# Patient Record
Sex: Male | Born: 1985 | Hispanic: No | Marital: Married | Smoking: Never smoker
Health system: Southern US, Community
[De-identification: ages and names within clinical notes are randomized; demographics above are authoritative.]

---

## 2016-12-27 ENCOUNTER — Ambulatory Visit: Payer: Managed Care, Other (non HMO) | Admitting: Family Medicine

## 2016-12-27 ENCOUNTER — Telehealth: Payer: Self-pay | Admitting: *Deleted

## 2016-12-27 NOTE — Telephone Encounter (Signed)
Can Geoffrey Brown see this pt on Thursday for a new patient appointment in the afternoon. This pt is highly frustrated with being cancelled for today. Geoffrey Brown is wiling to establish with the first available

## 2016-12-27 NOTE — Telephone Encounter (Signed)
As long as it is a thirty slot and not the 1130. That should be fine.

## 2016-12-27 NOTE — Telephone Encounter (Signed)
Pt will need a 30 min appt. There are no thirty minute appt on Thursday 12/29/16. Pt can schedule with another provider in the office if they want that day.

## 2016-12-27 NOTE — Telephone Encounter (Signed)
Thanks, pt is scheduled.

## 2016-12-27 NOTE — Telephone Encounter (Signed)
Please give a time and day to place patient for a New Patient appointment on Thursday 12/29/16-this will be his next available time off , pt was upset about having to reschedule.-Thanks

## 2016-12-29 ENCOUNTER — Encounter: Payer: Self-pay | Admitting: Family

## 2016-12-29 ENCOUNTER — Ambulatory Visit (INDEPENDENT_AMBULATORY_CARE_PROVIDER_SITE_OTHER): Payer: Managed Care, Other (non HMO) | Admitting: Family

## 2016-12-29 VITALS — BP 100/78 | HR 80 | Temp 98.3°F | Resp 15 | Ht 66.5 in | Wt 191.4 lb

## 2016-12-29 DIAGNOSIS — Z0000 Encounter for general adult medical examination without abnormal findings: Secondary | ICD-10-CM

## 2016-12-29 DIAGNOSIS — M25532 Pain in left wrist: Secondary | ICD-10-CM | POA: Diagnosis not present

## 2016-12-29 NOTE — Assessment & Plan Note (Addendum)
Up-to-date immunizations. Testicular exam performed. Encouraged to exercise. Labs ordered.

## 2016-12-29 NOTE — Progress Notes (Signed)
Pre visit review using our clinic review tool, if applicable. No additional management support is needed unless otherwise documented below in the visit note. 

## 2016-12-29 NOTE — Patient Instructions (Signed)
Pleasure meeting you  Let me know if wrist doesn't continue to get better  Fasting lab appt tomorrow   This is  Dr. Melina Schools  example of a  "Low GI"  Diet:  It will allow you to lose 4 to 8  lbs  per month if you follow it carefully.  Your goal with exercise is a minimum of 30 minutes of aerobic exercise 5 days per week (Walking does not count once it becomes easy!)    All of the foods can be found at grocery stores and in bulk at [employer].  The Atkins protein bars and shakes are available in more varieties at Target, WalMart and [employer]'s Foods.     7 AM Breakfast:  Choose from the following:  Low carbohydrate Protein  Shakes (I recommend the  Premier Protein chocolate shakes,  EAS AdvantEdge "Carb Control" shakes  Or the Atkins shakes all are under 3 net carbs)     a scrambled egg/bacon/cheese burrito made with Mission's "carb balance" whole wheat tortilla  (about 10 net carbs )  Medical laboratory scientific officer (basically a quiche without the pastry crust) that is eaten cold and very convenient way to get your eggs.  8 carbs)  If you make your own protein shakes, avoid bananas and pineapple,  And use low carb greek yogurt or original /unsweetened almond or soy milk    Avoid cereal and bananas, oatmeal and cream of wheat and grits. They are loaded with carbohydrates!   10 AM: high protein snack:  Protein bar by Atkins (the snack size, under 200 cal, usually < 6 net carbs).    A stick of cheese:  Around 1 carb,  100 cal     Dannon Light n Fit [place] Yogurt  (80 cal, 8 carbs)  Other so called "protein bars" and Greek yogurts tend to be loaded with carbohydrates.  Remember, in food advertising, the word "energy" is synonymous for " carbohydrate."  Lunch:   A Sandwich using the bread choices listed, Can use any  Eggs,  lunchmeat, grilled meat or canned tuna), avocado, regular mayo/mustard  and cheese.  A Salad using blue cheese, ranch,  Goddess or vinagrette,  Avoid taco shells,  croutons or "confetti" and no "candied nuts" but regular nuts OK.   No pretzels, nabs  or chips.  Pickles and miniature sweet peppers are a good low carb alternative that provide a "crunch"  The bread is the only source of carbohydrate in a sandwich and  can be decreased by trying some of the attached alternatives to traditional loaf bread   Avoid "Low fat dressings, as well as Reyne Dumas and [employer] dressings They are loaded with sugar!   3 PM/ Mid day  Snack:  Consider  1 ounce of  almonds, walnuts, pistachios, pecans, peanuts,  Macadamia nuts or a nut medley.  Avoid "granola and granola bars "  Mixed nuts are ok in moderation as long as there are no raisins,  cranberries or dried fruit.   KIND bars are OK if you get the low glycemic index variety   Try the prosciutto/mozzarella cheese sticks by Fiorruci  In deli /backery section   High protein      6 PM  Dinner:     Meat/fowl/fish with a green salad, and either broccoli, cauliflower, green beans, spinach, brussel sprouts or  Lima beans. DO NOT BREAD THE PROTEIN!!      There is a low carb pasta by Dreamfield's that is  acceptable and tastes great: only 5 digestible carbs/serving.( All grocery stores but BJs carry it ) Several ready made meals are available low carb:   Try Michel Angelo's chicken piccata or chicken or eggplant parm over low carb pasta.(Lowes and BJs)   Clifton Custard Sanchez's "Carnitas" (pulled pork, no sauce,  0 carbs) or his beef pot roast to make a dinner burrito (at BJ's)  Pesto over low carb pasta (bj's sells a good quality pesto in the center refrigerated section of the deli   Try satueeing  Roosvelt Harps with mushroooms as a good side   [employer] Giant makes a mashed cauliflower that tastes like mashed potatoes  Whole wheat pasta is still full of digestible carbs and  Not as low in glycemic index as Dreamfield's.   Brown rice is still rice,  So skip the rice and noodles if you eat [place] or [place] (or at least limit to 1/2  cup)  9 PM snack :   Breyer's "low carb" fudgsicle or  ice cream bar (Carb Smart line), or  Weight Watcher's ice cream bar , or another "no sugar added" ice cream;  a serving of fresh berries/cherries with whipped cream   Cheese or DANNON'S LlGHT N FIT GREEK YOGURT  8 ounces of [place] unsweetened almond/cococunut milk    Treat yourself to a parfait made with whipped cream blueberiies, walnuts and vanilla greek yogurt  Avoid bananas, pineapple, grapes  and watermelon on a regular basis because they are high in sugar.  THINK OF THEM AS DESSERT  Remember that snack Substitutions should be less than 10 NET carbs per serving and meals < 20 carbs. Remember to subtract fiber grams to get the "net carbs."  @  Health Maintenance, Male A healthy lifestyle and preventive care is important for your health and wellness. Ask your health care provider about what schedule of regular examinations is right for you. What should I know about weight and diet?  Eat a Healthy Diet  Eat plenty of vegetables, fruits, whole grains, low-fat dairy products, and lean protein.  Do not eat a lot of foods high in solid fats, added sugars, or salt. Maintain a Healthy Weight  Regular exercise can help you achieve or maintain a healthy weight. You should:  Do at least 150 minutes of exercise each week. The exercise should increase your heart rate and make you sweat (moderate-intensity exercise).  Do strength-training exercises at least twice a week. Watch Your Levels of Cholesterol and Blood Lipids  Have your blood tested for lipids and cholesterol every 5 years starting at 30 years of age. If you are at high risk for heart disease, you should start having your blood tested when you are 30 years old. You may need to have your cholesterol levels checked more often if:  Your lipid or cholesterol levels are high.  You are older than 30 years of age.  You are at high risk for heart  disease. What should I know about cancer screening? Many types of cancers can be detected early and may often be prevented. Lung Cancer  You should be screened every year for lung cancer if:  You are a current smoker who has smoked for at least 30 years.  You are a former smoker who has quit within the past 15 years.  Talk to your health care provider about your screening options, when you should start screening, and how often you should be screened. Colorectal Cancer  Routine colorectal cancer screening usually begins  at 30 years of age and should be repeated every 5-10 years until you are 30 years old. You may need to be screened more often if early forms of precancerous polyps or small growths are found. Your health care provider may recommend screening at an earlier age if you have risk factors for colon cancer.  Your health care provider may recommend using home test kits to check for hidden blood in the stool.  A small camera at the end of a tube can be used to examine your colon (sigmoidoscopy or colonoscopy). This checks for the earliest forms of colorectal cancer. Prostate and Testicular Cancer  Depending on your age and overall health, your health care provider may do certain tests to screen for prostate and testicular cancer.  Talk to your health care provider about any symptoms or concerns you have about testicular or prostate cancer. Skin Cancer  Check your skin from head to toe regularly.  Tell your health care provider about any new moles or changes in moles, especially if:  There is a change in a mole's size, shape, or color.  You have a mole that is larger than a pencil eraser.  Always use sunscreen. Apply sunscreen liberally and repeat throughout the day.  Protect yourself by wearing long sleeves, pants, a wide-brimmed hat, and sunglasses when outside. What should I know about heart disease, diabetes, and high blood pressure?  If you are 33-69 years of age,  have your blood pressure checked every 3-5 years. If you are 6 years of age or older, have your blood pressure checked every year. You should have your blood pressure measured twice-once when you are at a hospital or clinic, and once when you are not at a hospital or clinic. Record the average of the two measurements. To check your blood pressure when you are not at a hospital or clinic, you can use:  An automated blood pressure machine at a pharmacy.  A home blood pressure monitor.  Talk to your health care provider about your target blood pressure.  If you are between 60-47 years old, ask your health care provider if you should take aspirin to prevent heart disease.  Have regular diabetes screenings by checking your fasting blood sugar level.  If you are at a normal weight and have a low risk for diabetes, have this test once every three years after the age of 69.  If you are overweight and have a high risk for diabetes, consider being tested at a younger age or more often.  A one-time screening for abdominal aortic aneurysm (AAA) by ultrasound is recommended for men aged 65-75 years who are current or former smokers. What should I know about preventing infection? Hepatitis B  If you have a higher risk for hepatitis B, you should be screened for this virus. Talk with your health care provider to find out if you are at risk for hepatitis B infection. Hepatitis C  Blood testing is recommended for:  Everyone born from [birth year] through 1964.  Anyone with known risk factors for hepatitis C. Sexually Transmitted Diseases (STDs)  You should be screened each year for STDs including gonorrhea and chlamydia if:  You are sexually active and are younger than 30 years of age.  You are older than 30 years of age and your health care provider tells you that you are at risk for this type of infection.  Your sexual activity has changed since you were last screened and you are at an increased  risk for  chlamydia or gonorrhea. Ask your health care provider if you are at risk.  Talk with your health care provider about whether you are at high risk of being infected with HIV. Your health care provider may recommend a prescription medicine to help prevent HIV infection. What else can I do?  Schedule regular health, dental, and eye exams.  Stay current with your vaccines (immunizations).  Do not use any tobacco products, such as cigarettes, chewing tobacco, and e-cigarettes. If you need help quitting, ask your health care provider.  Limit alcohol intake to no more than 2 drinks per day. One drink equals 12 ounces of beer, 5 ounces of wine, or 1 ounces of hard liquor.  Do not use street drugs.  Do not share needles.  Ask your health care provider for help if you need support or information about quitting drugs.  Tell your health care provider if you often feel depressed.  Tell your health care provider if you have ever been abused or do not feel safe at home. This information is not intended to replace advice given to you by your health care provider. Make sure you discuss any questions you have with your health care provider. Document Released: 02/18/2008 Document Revised: 04/20/2016 Document Reviewed: 05/26/2015 Elsevier Interactive Patient Education  2017 [employer].

## 2016-12-29 NOTE — Progress Notes (Signed)
Subjective:    Patient ID: Geoffrey Brown, male    DOB: 08-Feb-1986, 31 y.o.   MRN: [id number]  CC: [name] is a 31 y.o. male who presents today for physical exam.    HPI: Hasn't had PCP in 3 years.   No concern for STDs. No masses, swelling noted in testicles. No urinary hesitancy, reduced urine stream. 4 days ago was playing cricket and injured his left wrist he does not remember how. Notes left lateral wrist pain which is improved with ice, compression of the past couple days. Notes decreased range of motion. No numbness or tingling.    Colorectal  Cancer Screening: No early family history.  Prostate Cancer Screening: Has been discussed with patient; patient declined following PSA based on risks versus benefits. No prostate cancer family history.  Lung Cancer Screening: No 30 year pack year history and > 55 years.  Immunizations       Tetanus - UTD        HIV Screening- Candidate for, declines Labs: Screening labs today. Exercise: Gets regular exercise.  Alcohol use: Occasional Smoking/tobacco use: Nonsmoker.  Regular dental exams: In need of dental exam. Wears seat belt: Yes. Skin: no new, concerning lesions. No h/o skin cancer  HISTORY:  No past medical history on file.  No past surgical history on file. Family History  Problem Relation Age of Onset  . Diabetes Mother   . Diabetes Father   . Colon cancer Neg Hx   . Prostate cancer Neg Hx       ALLERGIES: Lobster [shellfish allergy]  No current outpatient prescriptions on file prior to visit.   No current facility-administered medications on file prior to visit.     Social History  Substance Use Topics  . Smoking status: Never Smoker  . Smokeless tobacco: Never Used  . Alcohol use Yes    Review of Systems  Constitutional: Negative for chills and fever.  HENT: Negative for congestion.   Respiratory: Negative for cough.   Cardiovascular: Negative for chest pain, palpitations and leg swelling.    Gastrointestinal: Negative for diarrhea, nausea and vomiting.  Genitourinary: Negative for difficulty urinating, scrotal swelling and testicular pain.  Musculoskeletal: Positive for joint swelling. Negative for myalgias.  Skin: Negative for rash.  Neurological: Negative for headaches.  Hematological: Negative for adenopathy.  Psychiatric/Behavioral: Negative for confusion.      Objective:    BP 100/78 (BP Location: Right Arm, Patient Position: Sitting, Cuff Size: Normal)   Pulse 80   Temp 98.3 F (36.8 C) (Oral)   Resp 15   Ht 5' 6.5" (1.689 m)   Wt 191 lb 6.4 oz (86.8 kg)   SpO2 97%   BMI 30.43 kg/m   BP Readings from Last 3 Encounters:  12/29/16 100/78   Wt Readings from Last 3 Encounters:  12/29/16 191 lb 6.4 oz (86.8 kg)    Physical Exam  Constitutional: Vital signs are normal. He appears well-developed and well-nourished.  HENT:  Head: Normocephalic and atraumatic.  Right Ear: Hearing, tympanic membrane, external ear and ear canal normal. No drainage, swelling or tenderness. Tympanic membrane is not injected, not erythematous and not bulging. No middle ear effusion. No decreased hearing is noted.  Left Ear: Hearing, tympanic membrane, external ear and ear canal normal. No drainage, swelling or tenderness. Tympanic membrane is not injected, not erythematous and not bulging.  No middle ear effusion. No decreased hearing is noted.  Nose: Nose normal. Right sinus exhibits no maxillary sinus tenderness and  no frontal sinus tenderness. Left sinus exhibits no maxillary sinus tenderness and no frontal sinus tenderness.  Mouth/Throat: Uvula is midline, oropharynx is clear and moist and mucous membranes are normal. No oropharyngeal exudate, posterior oropharyngeal edema, posterior oropharyngeal erythema or tonsillar abscesses.  Eyes: Conjunctivae are normal.  Neck: No thyroid mass and no thyromegaly present.  Cardiovascular: Regular rhythm and normal heart sounds.    Pulmonary/Chest: Effort normal and breath sounds normal. No respiratory distress. He has no wheezes. He has no rhonchi. He has no rales.  Abdominal: Soft. Normal appearance and bowel sounds are normal. He exhibits no distension, no fluid wave, no ascites and no mass. There is no tenderness. There is no rigidity, no rebound, no guarding, no CVA tenderness, no tenderness at McBurney's point and negative Murphy's sign.  Genitourinary: Testes normal and penis normal. Cremasteric reflex is present. Right testis shows no mass, no swelling and no tenderness. Left testis shows no mass, no swelling and no tenderness. Circumcised.  Genitourinary Comments: Testicular exam performed. No masses or asymmetry appreciated.   Musculoskeletal:       Left wrist: He exhibits decreased range of motion, tenderness, bony tenderness and swelling.       Arms: Grip strength normal. Palpable radial pulses and sensation intact.  No pain or limited ROM with  okay sign. No tenderness of CMC or snuffbox tenderness noted.   tenderness along ulnar border of wrist. No tenderness along radial border of wrist.  No pain with resisted wrist dorsiflexion.   Lymphadenopathy:       Head (right side): No submental, no submandibular, no tonsillar, no preauricular, no posterior auricular and no occipital adenopathy present.       Head (left side): No submental, no submandibular, no tonsillar, no preauricular, no posterior auricular and no occipital adenopathy present.    He has no cervical adenopathy.    He has no axillary adenopathy.  Neurological: He is alert.  Skin: Skin is warm and dry.  Psychiatric: He has a normal mood and affect. His speech is normal and behavior is normal.  Vitals reviewed.      Assessment & Plan:   Problem List Items Addressed This Visit      Other   Routine physical examination - Primary    Up-to-date immunizations. Testicular exam performed. Encouraged to exercise. Labs ordered.       Relevant  Orders   CBC with Differential/Platelet   Comprehensive metabolic panel   Lipid panel   TSH   VITAMIN D 25 Hydroxy (Vit-D Deficiency, Fractures)   Hemoglobin A1c   Left wrist pain    Unsure if foosh injury as patient doesn't recall . Reassured as pain, swelling, ROM has improved. Offered x-ray and patient politely declined as he would like to see if pain, swelling continues to improve. He will let me know if not.          Mr. Akhtar does not currently have medications on file.   No orders of the defined types were placed in this encounter.   Return precautions given.   Risks, benefits, and alternatives of the medications and treatment plan prescribed today were discussed, and patient expressed understanding.   Education regarding symptom management and diagnosis given to patient on AVS.   Continue to follow with Rennie Plowman, FNP for routine health maintenance.   Rohin Lucianne Muss and I agreed with plan.   Rennie Plowman, FNP

## 2016-12-29 NOTE — Assessment & Plan Note (Signed)
Unsure if foosh injury as patient doesn't recall . Reassured as pain, swelling, ROM has improved. Offered x-ray and patient politely declined as he would like to see if pain, swelling continues to improve. He will let me know if not.

## 2016-12-30 ENCOUNTER — Other Ambulatory Visit (INDEPENDENT_AMBULATORY_CARE_PROVIDER_SITE_OTHER): Payer: Managed Care, Other (non HMO)

## 2016-12-30 DIAGNOSIS — Z0000 Encounter for general adult medical examination without abnormal findings: Secondary | ICD-10-CM | POA: Diagnosis not present

## 2016-12-31 LAB — LIPID PANEL
Chol/HDL Ratio: 5.3 ratio — ABNORMAL HIGH (ref 0.0–5.0)
Cholesterol, Total: 181 mg/dL (ref 100–199)
HDL: 34 mg/dL — AB (ref >39)
LDL Calculated: 120 mg/dL — ABNORMAL HIGH (ref 0–99)
TRIGLYCERIDES: 135 mg/dL (ref 0–149)
VLDL CHOLESTEROL CAL: 27 mg/dL (ref 5–40)

## 2016-12-31 LAB — CBC WITH DIFFERENTIAL/PLATELET
BASOS ABS: 0 10*3/uL (ref 0.0–0.2)
BASOS: 1 %
EOS (ABSOLUTE): 0.2 10*3/uL (ref 0.0–0.4)
Eos: 3 %
Hematocrit: 45.6 % (ref 37.5–51.0)
Hemoglobin: 15.5 g/dL (ref 13.0–17.7)
IMMATURE GRANS (ABS): 0 10*3/uL (ref 0.0–0.1)
IMMATURE GRANULOCYTES: 0 %
LYMPHS: 47 %
Lymphocytes Absolute: 3 10*3/uL (ref 0.7–3.1)
MCH: 27.3 pg (ref 26.6–33.0)
MCHC: 34 g/dL (ref 31.5–35.7)
MCV: 80 fL (ref 79–97)
MONOS ABS: 0.4 10*3/uL (ref 0.1–0.9)
Monocytes: 7 %
NEUTROS PCT: 42 %
Neutrophils Absolute: 2.6 10*3/uL (ref 1.4–7.0)
Platelets: 276 10*3/uL (ref 150–379)
RBC: 5.68 x10E6/uL (ref 4.14–5.80)
RDW: 13.6 % (ref 12.3–15.4)
WBC: 6.2 10*3/uL (ref 3.4–10.8)

## 2016-12-31 LAB — COMPREHENSIVE METABOLIC PANEL
A/G RATIO: 1.7 (ref 1.2–2.2)
ALT: 42 IU/L (ref 0–44)
AST: 27 IU/L (ref 0–40)
Albumin: 4.6 g/dL (ref 3.5–5.5)
Alkaline Phosphatase: 64 IU/L (ref 39–117)
BILIRUBIN TOTAL: 0.5 mg/dL (ref 0.0–1.2)
BUN/Creatinine Ratio: 13 (ref 9–20)
BUN: 16 mg/dL (ref 6–20)
CALCIUM: 9.3 mg/dL (ref 8.7–10.2)
CHLORIDE: 100 mmol/L (ref 96–106)
CO2: 26 mmol/L (ref 18–29)
Creatinine, Ser: 1.2 mg/dL (ref 0.76–1.27)
GFR calc Af Amer: 93 mL/min/{1.73_m2} (ref >59)
GFR calc non Af Amer: 81 mL/min/{1.73_m2} (ref >59)
GLUCOSE: 90 mg/dL (ref 65–99)
Globulin, Total: 2.7 g/dL (ref 1.5–4.5)
POTASSIUM: 4.8 mmol/L (ref 3.5–5.2)
Sodium: 140 mmol/L (ref 134–144)
TOTAL PROTEIN: 7.3 g/dL (ref 6.0–8.5)

## 2016-12-31 LAB — HEMOGLOBIN A1C
Est. average glucose Bld gHb Est-mCnc: 117 mg/dL
HEMOGLOBIN A1C: 5.7 % — AB (ref 4.8–5.6)

## 2016-12-31 LAB — TSH: TSH: 3.85 u[IU]/mL (ref 0.450–4.500)

## 2016-12-31 LAB — VITAMIN D 25 HYDROXY (VIT D DEFICIENCY, FRACTURES): Vit D, 25-Hydroxy: 14 ng/mL — ABNORMAL LOW (ref 30.0–100.0)

## 2017-01-03 ENCOUNTER — Ambulatory Visit: Payer: Managed Care, Other (non HMO) | Admitting: Family Medicine

## 2017-01-05 ENCOUNTER — Encounter: Payer: Self-pay | Admitting: Family

## 2017-04-18 ENCOUNTER — Ambulatory Visit (INDEPENDENT_AMBULATORY_CARE_PROVIDER_SITE_OTHER): Payer: Managed Care, Other (non HMO) | Admitting: Family

## 2017-04-18 ENCOUNTER — Encounter: Payer: Self-pay | Admitting: Family

## 2017-04-18 VITALS — BP 130/88 | HR 61 | Temp 98.3°F | Ht 66.5 in | Wt 188.8 lb

## 2017-04-18 DIAGNOSIS — H938X2 Other specified disorders of left ear: Secondary | ICD-10-CM | POA: Diagnosis not present

## 2017-04-18 DIAGNOSIS — R300 Dysuria: Secondary | ICD-10-CM | POA: Diagnosis not present

## 2017-04-18 NOTE — Progress Notes (Signed)
Pre visit review using our clinic review tool, if applicable. No additional management support is needed unless otherwise documented below in the visit note. 

## 2017-04-18 NOTE — Assessment & Plan Note (Signed)
Working diagnosis of keloid versus dermoid cyst. Due to size, placed referral dermatology further evaluation and possibly removal.

## 2017-04-18 NOTE — Assessment & Plan Note (Signed)
Nonspecific at this time. Working diagnosis of candidiasis, intermittently since patient is not circumcised. No erythema, exudate seen on exam today. Education and return precautions given to patient today. Politely declines any STD testing.

## 2017-04-18 NOTE — Patient Instructions (Signed)
Referral to derm for suspected keloid  You may be getting rash from time to time from not being uncircumsized. If you have a rash or any penile discharge, please return to office for further evaluation

## 2017-04-18 NOTE — Progress Notes (Signed)
Subjective:    Patient ID: Geoffrey Brown, male    DOB: 06/06/1986, 31 y.o.   MRN: [id number]  CC: Geoffrey Brown is a 31 y.o. male who presents today for an acute visit.    HPI: CC: left ear 'lump' for years in the bottom of lobe, comes and goes,  recently bigger in size  Ear pierced in left ear and re-pierced again 2017, and then a couple months later, 'small lump' appeared. Has had earring removed for 6 months, and lump getting bigger. No discharge, fever, increased warmth.   Patient also complains of intermittent burning sensation with urination, this may happen once every several months. As been going on for the past couple years. He denies any concerns for any STDs. No lesions, rashes no changes in discharge from penis. Engaged which is why he brings this up today.         HISTORY:  No past medical history on file. No past surgical history on file. Family History  Problem Relation Age of Onset  . Diabetes Mother   . Diabetes Father   . Colon cancer Neg Hx   . Prostate cancer Neg Hx     Allergies: Lobster [shellfish allergy] No current outpatient prescriptions on file prior to visit.   No current facility-administered medications on file prior to visit.     Social History  Substance Use Topics  . Smoking status: Never Smoker  . Smokeless tobacco: Never Used  . Alcohol use Yes    Review of Systems  Constitutional: Negative for chills and fever.  Respiratory: Negative for cough.   Cardiovascular: Negative for chest pain and palpitations.  Gastrointestinal: Negative for nausea and vomiting.  Genitourinary: Positive for dysuria. Negative for flank pain, frequency, penile pain, penile swelling, scrotal swelling, testicular pain and urgency.  Skin: Negative for color change and wound.      Objective:    BP 130/88   Pulse 61   Temp 98.3 F (36.8 C) (Oral)   Ht 5' 6.5" (1.689 m)   Wt 188 lb 12.8 oz (85.6 kg)   SpO2 98%   BMI 30.02 kg/m    Physical Exam    Constitutional: He appears well-developed and well-nourished.  HENT:  Head: Normocephalic.    Ears:  Left posterior ear nodule, non tender, non fluctuant. No erythema, streaking.   Cardiovascular: Regular rhythm and normal heart sounds.   Pulmonary/Chest: Effort normal and breath sounds normal. No respiratory distress. He has no wheezes. He has no rhonchi. He has no rales.  Genitourinary: Testes normal and penis normal. Uncircumcised. No penile erythema or penile tenderness. No discharge found.  Genitourinary Comments: No lesions, erythema.  Neurological: He is alert.  Skin: Skin is warm and dry.  Psychiatric: He has a normal mood and affect. His speech is normal and behavior is normal.  Vitals reviewed.      Assessment & Plan:   Problem List Items Addressed This Visit      Nervous and Auditory   Ear mass, left - Primary    Working diagnosis of keloid versus dermoid cyst. Due to size, placed referral dermatology further evaluation and possibly removal.      Relevant Orders   Ambulatory referral to Dermatology     Other   Dysuria    Nonspecific at this time. Working diagnosis of candidiasis, intermittently since patient is not circumcised. No erythema, exudate seen on exam today. Education and return precautions given to patient today. Politely declines any STD testing.  Geoffrey Brown does not currently have medications on file.   No orders of the defined types were placed in this encounter.   Return precautions given.   Risks, benefits, and alternatives of the medications and treatment plan prescribed today were discussed, and patient expressed understanding.   Education regarding symptom management and diagnosis given to patient on AVS.  Continue to follow with Geoffrey Brown, Geoffrey Bruney G, FNP for routine health maintenance.   Geoffrey Brown and I agreed with plan.   Geoffrey PlowmanMargaret Usher Hedberg, FNP

## 2018-03-26 ENCOUNTER — Encounter: Payer: Managed Care, Other (non HMO) | Admitting: Family

## 2018-03-30 ENCOUNTER — Encounter: Payer: Self-pay | Admitting: Family

## 2018-03-30 ENCOUNTER — Ambulatory Visit (INDEPENDENT_AMBULATORY_CARE_PROVIDER_SITE_OTHER): Payer: Managed Care, Other (non HMO) | Admitting: Family

## 2018-03-30 VITALS — BP 122/78 | HR 62 | Temp 98.6°F | Resp 16 | Ht 67.0 in | Wt 193.2 lb

## 2018-03-30 DIAGNOSIS — Z9189 Other specified personal risk factors, not elsewhere classified: Secondary | ICD-10-CM

## 2018-03-30 DIAGNOSIS — Z0000 Encounter for general adult medical examination without abnormal findings: Secondary | ICD-10-CM

## 2018-03-30 DIAGNOSIS — Z889 Allergy status to unspecified drugs, medicaments and biological substances status: Secondary | ICD-10-CM

## 2018-03-30 NOTE — Patient Instructions (Signed)
Great to see you!   Health Maintenance, Male A healthy lifestyle and preventive care is important for your health and wellness. Ask your health care provider about what schedule of regular examinations is right for you. What should I know about weight and diet? Eat a Healthy Diet  Eat plenty of vegetables, fruits, whole grains, low-fat dairy products, and lean protein.  Do not eat a lot of foods high in solid fats, added sugars, or salt.  Maintain a Healthy Weight Regular exercise can help you achieve or maintain a healthy weight. You should:  Do at least 150 minutes of exercise each week. The exercise should increase your heart rate and make you sweat (moderate-intensity exercise).  Do strength-training exercises at least twice a week.  Watch Your Levels of Cholesterol and Blood Lipids  Have your blood tested for lipids and cholesterol every 5 years starting at 31 years of age. If you are at high risk for heart disease, you should start having your blood tested when you are 31 years old. You may need to have your cholesterol levels checked more often if: ? Your lipid or cholesterol levels are high. ? You are older than 31 years of age. ? You are at high risk for heart disease.  What should I know about cancer screening? Many types of cancers can be detected early and may often be prevented. Lung Cancer  You should be screened every year for lung cancer if: ? You are a current smoker who has smoked for at least 30 years. ? You are a former smoker who has quit within the past 15 years.  Talk to your health care provider about your screening options, when you should start screening, and how often you should be screened.  Colorectal Cancer  Routine colorectal cancer screening usually begins at 31 years of age and should be repeated every 5-10 years until you are 31 years old. You may need to be screened more often if early forms of precancerous polyps or small growths are found.  Your health care provider may recommend screening at an earlier age if you have risk factors for colon cancer.  Your health care provider may recommend using home test kits to check for hidden blood in the stool.  A small camera at the end of a tube can be used to examine your colon (sigmoidoscopy or colonoscopy). This checks for the earliest forms of colorectal cancer.  Prostate and Testicular Cancer  Depending on your age and overall health, your health care provider may do certain tests to screen for prostate and testicular cancer.  Talk to your health care provider about any symptoms or concerns you have about testicular or prostate cancer.  Skin Cancer  Check your skin from head to toe regularly.  Tell your health care provider about any new moles or changes in moles, especially if: ? There is a change in a mole's size, shape, or color. ? You have a mole that is larger than a pencil eraser.  Always use sunscreen. Apply sunscreen liberally and repeat throughout the day.  Protect yourself by wearing long sleeves, pants, a wide-brimmed hat, and sunglasses when outside.  What should I know about heart disease, diabetes, and high blood pressure?  If you are 18-39 years of age, have your blood pressure checked every 3-5 years. If you are 40 years of age or older, have your blood pressure checked every year. You should have your blood pressure measured twice-once when you are at a   hospital or clinic, and once when you are not at a hospital or clinic. Record the average of the two measurements. To check your blood pressure when you are not at a hospital or clinic, you can use: ? An automated blood pressure machine at a pharmacy. ? A home blood pressure monitor.  Talk to your health care provider about your target blood pressure.  If you are between 45-79 years old, ask your health care provider if you should take aspirin to prevent heart disease.  Have regular diabetes screenings by  checking your fasting blood sugar level. ? If you are at a normal weight and have a low risk for diabetes, have this test once every three years after the age of 45. ? If you are overweight and have a high risk for diabetes, consider being tested at a younger age or more often.  A one-time screening for abdominal aortic aneurysm (AAA) by ultrasound is recommended for men aged 65-75 years who are current or former smokers. What should I know about preventing infection? Hepatitis B If you have a higher risk for hepatitis B, you should be screened for this virus. Talk with your health care provider to find out if you are at risk for hepatitis B infection. Hepatitis C Blood testing is recommended for:  Everyone born from 1945 through 1965.  Anyone with known risk factors for hepatitis C.  Sexually Transmitted Diseases (STDs)  You should be screened each year for STDs including gonorrhea and chlamydia if: ? You are sexually active and are younger than 31 years of age. ? You are older than 31 years of age and your health care provider tells you that you are at risk for this type of infection. ? Your sexual activity has changed since you were last screened and you are at an increased risk for chlamydia or gonorrhea. Ask your health care provider if you are at risk.  Talk with your health care provider about whether you are at high risk of being infected with HIV. Your health care provider may recommend a prescription medicine to help prevent HIV infection.  What else can I do?  Schedule regular health, dental, and eye exams.  Stay current with your vaccines (immunizations).  Do not use any tobacco products, such as cigarettes, chewing tobacco, and e-cigarettes. If you need help quitting, ask your health care provider.  Limit alcohol intake to no more than 2 drinks per day. One drink equals 12 ounces of beer, 5 ounces of wine, or 1 ounces of hard liquor.  Do not use street drugs.  Do not  share needles.  Ask your health care provider for help if you need support or information about quitting drugs.  Tell your health care provider if you often feel depressed.  Tell your health care provider if you have ever been abused or do not feel safe at home. This information is not intended to replace advice given to you by your health care provider. Make sure you discuss any questions you have with your health care provider. Document Released: 02/18/2008 Document Revised: 04/20/2016 Document Reviewed: 05/26/2015 Elsevier Interactive Patient Education  2018 [employer] Inc[employer]  

## 2018-03-30 NOTE — Assessment & Plan Note (Signed)
recent localized reaction from a bug bite, resolved. I had a long discussion with him and explained that he seems to be a more allergic person.  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  He politely declines at this time as he assured me he stays  very vigilant and ensures that he doesn't eat lobster.  Also offered an allergy consult to further tease out his allergies. Patient declines at this time and feels more comfortable staying vigilant and will certainly let me know if he continues to have allergies to insects, develops new allergies to food, medications.  Will follow

## 2018-03-30 NOTE — Assessment & Plan Note (Addendum)
Screening labs ordered.  Counseled patient that in the absence of symptoms, these may or may not be covered by insurance.  Patient felt more comfortable ordering the same labs that were ordered last year.  Discussed in great detail patient is desire to conceive with wife.  Education provided on ovulation, timing of intercourse.  Namely,  advised patient to maintain healthy weight and lifestyle.

## 2018-03-30 NOTE — Progress Notes (Signed)
Subjective:    Patient ID: Geoffrey Brown, male    DOB: 1986-05-29, 32 y.o.   MRN: [id number]  CC: Geoffrey Brown is a 32 y.o. male who presents today for physical exam.    HPI: Overall doing well today.  He states he was married 6 months ago and very happy.  No depression No concerns for any STDs and declines any testing of that nature.   He does describe recently had a bug bite over his left biceps which was quite red and remained that way for couple days.  It is resolved at this time.  He notes that years ago he had a reaction where he felt his face was swelling to lobster.  He is never carried an EpiPen.  Nor is he ever had any allergy testing.  He does also state that he is trying to conceive with his wife and looks forward to being apparent.     Colorectal  Cancer Screening: no family history. Prostate Cancer Screening: Not of age.  Lung Cancer Screening: No 30 year pack year history and > 55 years.  Immunizations       Tetanus - utd         Labs: Screening labs today. Exercise: Gets regular exercise.  Alcohol use:  occassional Smoking/tobacco use: Nonsmoker.  Regular dental exams: UTD, [place] in [place].  Wears seat belt: Yes. Skin: no longer follow with dermatology routinely; keloid left ear  HISTORY:  History reviewed. No pertinent past medical history.  History reviewed. No pertinent surgical history. Family History  Problem Relation Age of Onset  . Diabetes Mother   . Diabetes Father   . Colon cancer Neg Hx   . Prostate cancer Neg Hx       ALLERGIES: Lobster [shellfish allergy]  No current outpatient medications on file prior to visit.   No current facility-administered medications on file prior to visit.     Social History   Tobacco Use  . Smoking status: Never Smoker  . Smokeless tobacco: Never Used  Substance Use Topics  . Alcohol use: Yes    Comment: occasional  . Drug use: No    Review of Systems  Constitutional: Negative for chills  and fever.  HENT: Negative for congestion.   Respiratory: Negative for cough.   Cardiovascular: Negative for chest pain, palpitations and leg swelling.  Gastrointestinal: Negative for diarrhea, nausea and vomiting.  Genitourinary: Negative for difficulty urinating, discharge, frequency, genital sores and penile pain.  Musculoskeletal: Negative for myalgias.  Skin: Negative for rash (resolved from insect bite).  Neurological: Negative for headaches.  Hematological: Negative for adenopathy.  Psychiatric/Behavioral: Negative for confusion.      Objective:    BP 122/78 (BP Location: Left Arm, Patient Position: Sitting, Cuff Size: Normal)   Pulse 62   Temp 98.6 F (37 C) (Oral)   Resp 16   Ht 5\' 7"  (1.702 m)   Wt 193 lb 4 oz (87.7 kg)   SpO2 98%   BMI 30.27 kg/m   BP Readings from Last 3 Encounters:  03/30/18 122/78  04/18/17 130/88  12/29/16 100/78   Wt Readings from Last 3 Encounters:  03/30/18 193 lb 4 oz (87.7 kg)  04/18/17 188 lb 12.8 oz (85.6 kg)  12/29/16 191 lb 6.4 oz (86.8 kg)    Physical Exam  Constitutional: He appears well-developed and well-nourished.  Neck: No thyroid mass and no thyromegaly present.  Cardiovascular: Regular rhythm and normal heart sounds.  Pulmonary/Chest: Effort normal and  breath sounds normal. No respiratory distress. He has no wheezes. He has no rhonchi. He has no rales.  Lymphadenopathy:       Head (right side): No submental, no submandibular, no tonsillar, no preauricular, no posterior auricular and no occipital adenopathy present.       Head (left side): No submental, no submandibular, no tonsillar, no preauricular, no posterior auricular and no occipital adenopathy present.    He has no cervical adenopathy.    He has no axillary adenopathy.  Neurological: He is alert.  Skin: Skin is warm and dry.   No rash, lesion seen on left forearm.   Psychiatric: He has a normal mood and affect. His speech is normal and behavior is normal.    Vitals reviewed.      Assessment & Plan:   Problem List Items Addressed This Visit      Other   Routine physical examination - Primary    Screening labs ordered.  Counseled patient that in the absence of symptoms, these may or may not be covered by insurance.  Patient felt more comfortable ordering the same labs that were ordered last year.  Discussed in great detail patient is desire to conceive with wife.  Education provided on ovulation, timing of intercourse.  Namely,  advised patient to maintain healthy weight and lifestyle.      Relevant Orders   CBC with Differential/Platelet   Comprehensive metabolic panel   Hemoglobin A1c   Lipid panel   TSH   VITAMIN D 25 Hydroxy (Vit-D Deficiency, Fractures)   H/O multiple allergies     recent localized reaction from a bug bite, resolved. I had a long discussion with him and explained that he seems to be a more allergic person.  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  He politely declines at this time as he assured me he stays  very vigilant and ensures that he doesn't eat lobster.  Also offered an allergy consult to further tease out his allergies. Patient declines at this time and feels more comfortable staying vigilant and will certainly let me know if he continues to have allergies to insects, develops new allergies to food, medications.  Will follow          Geoffrey Brown does not currently have medications on file.   No orders of the defined types were placed in this encounter.   Return precautions given.   Risks, benefits, and alternatives of the medications and treatment plan prescribed today were discussed, and patient expressed understanding.   Education regarding symptom management and diagnosis given to patient on AVS.   Continue to follow with Geoffrey Grana, Geoffrey Brown for routine health maintenance.   Geoffrey Brown and I agreed with plan.   Geoffrey Plowman, Geoffrey Brown

## 2018-04-02 ENCOUNTER — Other Ambulatory Visit (INDEPENDENT_AMBULATORY_CARE_PROVIDER_SITE_OTHER): Payer: Managed Care, Other (non HMO)

## 2018-04-02 DIAGNOSIS — Z0000 Encounter for general adult medical examination without abnormal findings: Secondary | ICD-10-CM | POA: Diagnosis not present

## 2018-04-02 DIAGNOSIS — R7989 Other specified abnormal findings of blood chemistry: Secondary | ICD-10-CM

## 2018-04-02 LAB — CBC WITH DIFFERENTIAL/PLATELET
BASOS PCT: 1 % (ref 0.0–3.0)
Basophils Absolute: 0.1 10*3/uL (ref 0.0–0.1)
EOS PCT: 5.1 % — AB (ref 0.0–5.0)
Eosinophils Absolute: 0.3 10*3/uL (ref 0.0–0.7)
HCT: 44.1 % (ref 39.0–52.0)
Hemoglobin: 14.9 g/dL (ref 13.0–17.0)
LYMPHS ABS: 2.2 10*3/uL (ref 0.7–4.0)
Lymphocytes Relative: 37.3 % (ref 12.0–46.0)
MCHC: 33.9 g/dL (ref 30.0–36.0)
MCV: 84.9 fl (ref 78.0–100.0)
MONO ABS: 0.5 10*3/uL (ref 0.1–1.0)
Monocytes Relative: 7.8 % (ref 3.0–12.0)
NEUTROS ABS: 2.8 10*3/uL (ref 1.4–7.7)
NEUTROS PCT: 48.8 % (ref 43.0–77.0)
PLATELETS: 168 10*3/uL (ref 150.0–400.0)
RBC: 5.19 Mil/uL (ref 4.22–5.81)
RDW: 13.5 % (ref 11.5–15.5)
WBC: 5.8 10*3/uL (ref 4.0–10.5)

## 2018-04-02 LAB — COMPREHENSIVE METABOLIC PANEL
ALK PHOS: 56 U/L (ref 39–117)
ALT: 50 U/L (ref 0–53)
AST: 26 U/L (ref 0–37)
Albumin: 4.4 g/dL (ref 3.5–5.2)
BUN: 13 mg/dL (ref 6–23)
CO2: 29 meq/L (ref 19–32)
Calcium: 9.9 mg/dL (ref 8.4–10.5)
Chloride: 102 mEq/L (ref 96–112)
Creatinine, Ser: 1.27 mg/dL (ref 0.40–1.50)
GFR: 70.14 mL/min (ref >60.00)
GLUCOSE: 97 mg/dL (ref 70–99)
POTASSIUM: 4.3 meq/L (ref 3.5–5.1)
SODIUM: 139 meq/L (ref 135–145)
TOTAL PROTEIN: 8 g/dL (ref 6.0–8.3)
Total Bilirubin: 0.6 mg/dL (ref 0.2–1.2)

## 2018-04-02 LAB — LIPID PANEL
CHOL/HDL RATIO: 5
CHOLESTEROL: 200 mg/dL (ref 0–200)
HDL: 38.7 mg/dL — ABNORMAL LOW (ref >39.00)
NonHDL: 161.21
Triglycerides: 275 mg/dL — ABNORMAL HIGH (ref 0.0–149.0)
VLDL: 55 mg/dL — AB (ref 0.0–40.0)

## 2018-04-02 LAB — HEMOGLOBIN A1C: HEMOGLOBIN A1C: 5.7 % (ref 4.6–6.5)

## 2018-04-02 LAB — TSH: TSH: 4.81 u[IU]/mL — ABNORMAL HIGH (ref 0.35–4.50)

## 2018-04-02 LAB — VITAMIN D 25 HYDROXY (VIT D DEFICIENCY, FRACTURES): VITD: 12.74 ng/mL — ABNORMAL LOW (ref 30.00–100.00)

## 2018-04-02 LAB — LDL CHOLESTEROL, DIRECT: LDL DIRECT: 129 mg/dL

## 2018-04-04 ENCOUNTER — Encounter: Payer: Managed Care, Other (non HMO) | Admitting: Family

## 2018-04-04 NOTE — Addendum Note (Signed)
Addended by: Allegra GranaARNETT, Carnisha Feltz G on: 04/04/2018 10:02 AM   Modules accepted: Orders

## 2018-04-10 ENCOUNTER — Telehealth: Payer: Self-pay | Admitting: Family

## 2018-04-10 NOTE — Telephone Encounter (Signed)
Pt would like someone from the office to call and give him his last lab results.

## 2018-04-10 NOTE — Telephone Encounter (Signed)
Patient advised of result and verbalized an understanding.  

## 2019-04-25 ENCOUNTER — Other Ambulatory Visit: Payer: Self-pay

## 2019-04-29 ENCOUNTER — Encounter: Payer: Self-pay | Admitting: Family

## 2019-04-29 ENCOUNTER — Ambulatory Visit (INDEPENDENT_AMBULATORY_CARE_PROVIDER_SITE_OTHER): Payer: Commercial Indemnity | Admitting: Family

## 2019-04-29 ENCOUNTER — Encounter: Payer: Commercial Indemnity | Admitting: Family

## 2019-04-29 ENCOUNTER — Other Ambulatory Visit: Payer: Self-pay

## 2019-04-29 VITALS — BP 98/60 | HR 60 | Temp 97.6°F | Ht 67.0 in | Wt 182.2 lb

## 2019-04-29 DIAGNOSIS — Z0000 Encounter for general adult medical examination without abnormal findings: Secondary | ICD-10-CM

## 2019-04-29 DIAGNOSIS — R7989 Other specified abnormal findings of blood chemistry: Secondary | ICD-10-CM

## 2019-04-29 DIAGNOSIS — Z23 Encounter for immunization: Secondary | ICD-10-CM

## 2019-04-29 LAB — CBC WITH DIFFERENTIAL/PLATELET
Basophils Absolute: 0.1 10*3/uL (ref 0.0–0.1)
Basophils Relative: 1.2 % (ref 0.0–3.0)
Eosinophils Absolute: 1.3 10*3/uL — ABNORMAL HIGH (ref 0.0–0.7)
Eosinophils Relative: 19.6 % — ABNORMAL HIGH (ref 0.0–5.0)
HCT: 45.8 % (ref 39.0–52.0)
Hemoglobin: 15 g/dL (ref 13.0–17.0)
Lymphocytes Relative: 37.1 % (ref 12.0–46.0)
Lymphs Abs: 2.5 10*3/uL (ref 0.7–4.0)
MCHC: 32.7 g/dL (ref 30.0–36.0)
MCV: 84.6 fl (ref 78.0–100.0)
Monocytes Absolute: 0.5 10*3/uL (ref 0.1–1.0)
Monocytes Relative: 6.6 % (ref 3.0–12.0)
Neutro Abs: 2.4 10*3/uL (ref 1.4–7.7)
Neutrophils Relative %: 35.5 % — ABNORMAL LOW (ref 43.0–77.0)
Platelets: 217 10*3/uL (ref 150.0–400.0)
RBC: 5.42 Mil/uL (ref 4.22–5.81)
RDW: 13.7 % (ref 11.5–15.5)
WBC: 6.8 10*3/uL (ref 4.0–10.5)

## 2019-04-29 LAB — LIPID PANEL
Cholesterol: 188 mg/dL (ref 0–200)
HDL: 37 mg/dL — ABNORMAL LOW (ref >39.00)
NonHDL: 150.51
Total CHOL/HDL Ratio: 5
Triglycerides: 212 mg/dL — ABNORMAL HIGH (ref 0.0–149.0)
VLDL: 42.4 mg/dL — ABNORMAL HIGH (ref 0.0–40.0)

## 2019-04-29 LAB — COMPREHENSIVE METABOLIC PANEL
ALT: 42 U/L (ref 0–53)
AST: 29 U/L (ref 0–37)
Albumin: 4.9 g/dL (ref 3.5–5.2)
Alkaline Phosphatase: 59 U/L (ref 39–117)
BUN: 16 mg/dL (ref 6–23)
CO2: 28 mEq/L (ref 19–32)
Calcium: 9.8 mg/dL (ref 8.4–10.5)
Chloride: 102 mEq/L (ref 96–112)
Creatinine, Ser: 1.15 mg/dL (ref 0.40–1.50)
GFR: 73.5 mL/min (ref >60.00)
Glucose, Bld: 100 mg/dL — ABNORMAL HIGH (ref 70–99)
Potassium: 4.1 mEq/L (ref 3.5–5.1)
Sodium: 138 mEq/L (ref 135–145)
Total Bilirubin: 0.5 mg/dL (ref 0.2–1.2)
Total Protein: 7.7 g/dL (ref 6.0–8.3)

## 2019-04-29 LAB — HEMOGLOBIN A1C: Hgb A1c MFr Bld: 5.7 % (ref 4.6–6.5)

## 2019-04-29 LAB — LDL CHOLESTEROL, DIRECT: Direct LDL: 112 mg/dL

## 2019-04-29 NOTE — Patient Instructions (Addendum)
Ensure you follow up with dermatology for skin lesion left thigh; since you are established, I will not place referral.  Let me know if any issues in getting appointment  Today we discussed referrals, orders. THYROID ultrasound   I have placed these orders in the system for you.  Please be sure to give [place] a call if you have not heard from our office regarding this. We should hear from [place] within ONE week with information regarding your appointment. If not, please let me know immediately.     Stay safe!   Health Maintenance, Male Adopting a healthy lifestyle and getting preventive care are important in promoting health and wellness. Ask your health care provider about:  The right schedule for you to have regular tests and exams.  Things you can do on your own to prevent diseases and keep yourself healthy. What should I know about diet, weight, and exercise? Eat a healthy diet   Eat a diet that includes plenty of vegetables, fruits, low-fat dairy products, and lean protein.  Do not eat a lot of foods that are high in solid fats, added sugars, or sodium. Maintain a healthy weight Body mass index (BMI) is a measurement that can be used to identify possible weight problems. It estimates body fat based on height and weight. Your health care provider can help determine your BMI and help you achieve or maintain a healthy weight. Get regular exercise Get regular exercise. This is one of the most important things you can do for your health. Most adults should:  Exercise for at least 150 minutes each week. The exercise should increase your heart rate and make you sweat (moderate-intensity exercise).  Do strengthening exercises at least twice a week. This is in addition to the moderate-intensity exercise.  Spend less time sitting. Even light physical activity can be beneficial. Watch cholesterol and blood lipids Have your blood tested for lipids and cholesterol at 32 years of age, then have this  test every 5 years. You may need to have your cholesterol levels checked more often if:  Your lipid or cholesterol levels are high.  You are older than 32 years of age.  You are at high risk for heart disease. What should I know about cancer screening? Many types of cancers can be detected early and may often be prevented. Depending on your health history and family history, you may need to have cancer screening at various ages. This may include screening for:  Colorectal cancer.  Prostate cancer.  Skin cancer.  Lung cancer. What should I know about heart disease, diabetes, and high blood pressure? Blood pressure and heart disease  High blood pressure causes heart disease and increases the risk of stroke. This is more likely to develop in people who have high blood pressure readings, are of African descent, or are overweight.  Talk with your health care provider about your target blood pressure readings.  Have your blood pressure checked: ? Every 3-5 years if you are 33-57 years of age. ? Every year if you are 88 years old or older.  If you are between the ages of 50 and 62 and are a current or former smoker, ask your health care provider if you should have a one-time screening for abdominal aortic aneurysm (AAA). Diabetes Have regular diabetes screenings. This checks your fasting blood sugar level. Have the screening done:  Once every three years after age 14 if you are at a normal weight and have a low risk for  diabetes.  More often and at a younger age if you are overweight or have a high risk for diabetes. What should I know about preventing infection? Hepatitis B If you have a higher risk for hepatitis B, you should be screened for this virus. Talk with your health care provider to find out if you are at risk for hepatitis B infection. Hepatitis C Blood testing is recommended for:  Everyone born from [id number] through 1965.  Anyone with known risk factors for hepatitis C.  Sexually transmitted infections (STIs)  You should be screened each year for STIs, including gonorrhea and chlamydia, if: ? You are sexually active and are younger than 32 years of age. ? You are older than 32 years of age and your health care provider tells you that you are at risk for this type of infection. ? Your sexual activity has changed since you were last screened, and you are at increased risk for chlamydia or gonorrhea. Ask your health care provider if you are at risk.  Ask your health care provider about whether you are at high risk for HIV. Your health care provider may recommend a prescription medicine to help prevent HIV infection. If you choose to take medicine to prevent HIV, you should first get tested for HIV. You should then be tested every 3 months for as long as you are taking the medicine. Follow these instructions at home: Lifestyle  Do not use any products that contain nicotine or tobacco, such as cigarettes, e-cigarettes, and chewing tobacco. If you need help quitting, ask your health care provider.  Do not use street drugs.  Do not share needles.  Ask your health care provider for help if you need support or information about quitting drugs. Alcohol use  Do not drink alcohol if your health care provider tells you not to drink.  If you drink alcohol: ? Limit how much you have to 0-2 drinks a day. ? Be aware of how much alcohol is in your drink. In the [place] one drink equals one 12 oz bottle of beer (355 mL), one 5 oz glass of wine (148 mL), or one 1 oz glass of hard liquor (44 mL). General instructions  Schedule regular health, dental, and eye exams.  Stay current with your vaccines.  Tell your health care provider if: ? You often feel depressed. ? You have ever been abused or do not feel safe at home. Summary  Adopting a healthy lifestyle and getting preventive care are important in promoting health and wellness.  Follow your health care provider's  instructions about healthy diet, exercising, and getting tested or screened for diseases.  Follow your health care provider's instructions on monitoring your cholesterol and blood pressure. This information is not intended to replace advice given to you by your health care provider. Make sure you discuss any questions you have with your health care provider. Document Released: 02/18/2008 Document Revised: 08/15/2018 Document Reviewed: 08/15/2018 Elsevier Patient Education  2020 [employer]

## 2019-04-29 NOTE — Progress Notes (Signed)
Subjective:    Patient ID: Geoffrey Brown, male    DOB: Jan 01, 1986, 33 y.o.   MRN: [id number]  CC: Geoffrey Brown is a [age] y.o. male who presents today for physical exam.    HPI: Feeling well.  No complaints. His wife is expecting little girl which he is excited about. He does note on left medial thigh small skin lesion.  It is bothersome as it rubs when he is jogging.Not bleeding or itching.    Colorectal  Cancer Screening: no early family history Prostate Cancer Screening: Has been discussed with patient; patient declined following PSA based on risks versus benefits. No prostate cancer family history.  Lung Cancer Screening: No 30 year pack year history and > 55 years.  Immunizations       Tetanus - utd  Labs: Screening labs today. Exercise: Gets regular exercise with jogging Alcohol use: occasional Smoking/tobacco use: Nonsmoker.  Wears seat belt: Yes. Skin: Skin lesion left thigh. No other new lesions.  HISTORY:  History reviewed. No pertinent past medical history.  History reviewed. No pertinent surgical history. Family History  Problem Relation Age of Onset  . Diabetes Mother   . Diabetes Father   . Colon cancer Neg Hx   . Prostate cancer Neg Hx       ALLERGIES: Lobster [shellfish allergy]  No current outpatient medications on file prior to visit.   No current facility-administered medications on file prior to visit.     Social History   Tobacco Use  . Smoking status: Never Smoker  . Smokeless tobacco: Never Used  Substance Use Topics  . Alcohol use: Yes    Comment: occasional  . Drug use: No    Review of Systems  Constitutional: Negative for chills and fever.  HENT: Negative for congestion, trouble swallowing and voice change.   Respiratory: Negative for cough.   Cardiovascular: Negative for chest pain, palpitations and leg swelling.  Gastrointestinal: Negative for diarrhea, nausea and vomiting.  Musculoskeletal: Negative for myalgias.  Skin: Negative  for rash.  Neurological: Negative for headaches.  Hematological: Negative for adenopathy.  Psychiatric/Behavioral: Negative for confusion.      Objective:    BP 98/60   Pulse 60   Temp 97.6 F (36.4 C) (Temporal)   Ht 5\' 7"  (1.702 m)   Wt 182 lb 3.2 oz (82.6 kg)   SpO2 97%   BMI 28.54 kg/m   BP Readings from Last 3 Encounters:  04/29/19 98/60  03/30/18 122/78  04/18/17 130/88   Wt Readings from Last 3 Encounters:  04/29/19 182 lb 3.2 oz (82.6 kg)  03/30/18 193 lb 4 oz (87.7 kg)  04/18/17 188 lb 12.8 oz (85.6 kg)    Physical Exam Vitals signs reviewed.  Constitutional:      Appearance: He is well-developed.  Neck:     Thyroid: Thyromegaly present. No thyroid mass.     Comments: Symmetrically enlarged thyroid Cardiovascular:     Rate and Rhythm: Regular rhythm.     Heart sounds: Normal heart sounds.  Pulmonary:     Effort: Pulmonary effort is normal. No respiratory distress.     Breath sounds: Normal breath sounds. No wheezing, rhonchi or rales.  Lymphadenopathy:     Head:     Right side of head: No submental, submandibular, tonsillar, preauricular, posterior auricular or occipital adenopathy.     Left side of head: No submental, submandibular, tonsillar, preauricular, posterior auricular or occipital adenopathy.     Cervical: No cervical adenopathy.  Skin:  General: Skin is warm and dry.  Neurological:     Mental Status: He is alert.  Psychiatric:        Speech: Speech normal.        Behavior: Behavior normal.        Assessment & Plan:   Problem List Items Addressed This Visit      Other   Routine physical examination - Primary    Subjectively enlarged thyroid on exam.  Pending ultrasound. Patient advised to see dermatology, he will call to make an appointment.       Relevant Orders   TSH   CBC with Differential/Platelet   Comprehensive metabolic panel   Hemoglobin A1c   Lipid panel   VITAMIN D 25 Hydroxy (Vit-D Deficiency, Fractures)   US  THYROID       Phillip Sullenger does not currently have medications on file.   No orders of the defined types were placed in this encounter.   Return precautions given.   Risks, benefits, and alternatives of the medications and treatment plan prescribed today were discussed, and patient expressed understanding.   Education regarding symptom management and diagnosis given to patient on AVS.   Continue to follow with Burnard Hawthorne, FNP for routine health maintenance.   Courage Dwyane Dee and I agreed with plan.   Mable Paris, FNP

## 2019-04-29 NOTE — Assessment & Plan Note (Addendum)
Subjectively enlarged thyroid on exam.  Pending ultrasound. Patient advised to see dermatology, he will call to make an appointment.

## 2019-04-30 LAB — VITAMIN D 25 HYDROXY (VIT D DEFICIENCY, FRACTURES): VITD: 15.49 ng/mL — ABNORMAL LOW (ref 30.00–100.00)

## 2019-04-30 LAB — TSH: TSH: 2.49 u[IU]/mL (ref 0.35–4.50)

## 2019-05-03 NOTE — Addendum Note (Signed)
Addended by: Burnard Hawthorne on: 05/03/2019 12:42 PM   Modules accepted: Orders

## 2019-05-06 ENCOUNTER — Encounter: Payer: Self-pay | Admitting: Family

## 2019-05-09 ENCOUNTER — Ambulatory Visit: Payer: Commercial Indemnity

## 2019-05-14 ENCOUNTER — Other Ambulatory Visit: Payer: Self-pay

## 2019-05-14 ENCOUNTER — Other Ambulatory Visit (INDEPENDENT_AMBULATORY_CARE_PROVIDER_SITE_OTHER): Payer: Managed Care, Other (non HMO)

## 2019-05-14 DIAGNOSIS — R7989 Other specified abnormal findings of blood chemistry: Secondary | ICD-10-CM

## 2019-05-14 LAB — CBC WITH DIFFERENTIAL/PLATELET
Basophils Absolute: 0.1 10*3/uL (ref 0.0–0.1)
Basophils Relative: 1.4 % (ref 0.0–3.0)
Eosinophils Absolute: 2 10*3/uL — ABNORMAL HIGH (ref 0.0–0.7)
Eosinophils Relative: 22.1 % — ABNORMAL HIGH (ref 0.0–5.0)
HCT: 44.1 % (ref 39.0–52.0)
Hemoglobin: 14.6 g/dL (ref 13.0–17.0)
Lymphocytes Relative: 39 % (ref 12.0–46.0)
Lymphs Abs: 3.6 10*3/uL (ref 0.7–4.0)
MCHC: 33.1 g/dL (ref 30.0–36.0)
MCV: 83.2 fl (ref 78.0–100.0)
Monocytes Absolute: 0.6 10*3/uL (ref 0.1–1.0)
Monocytes Relative: 6.6 % (ref 3.0–12.0)
Neutro Abs: 2.9 10*3/uL (ref 1.4–7.7)
Neutrophils Relative %: 30.9 % — ABNORMAL LOW (ref 43.0–77.0)
Platelets: 240 10*3/uL (ref 150.0–400.0)
RBC: 5.3 Mil/uL (ref 4.22–5.81)
RDW: 13.8 % (ref 11.5–15.5)
WBC: 9.3 10*3/uL (ref 4.0–10.5)

## 2019-05-17 ENCOUNTER — Other Ambulatory Visit: Payer: Self-pay | Admitting: Family

## 2019-05-17 DIAGNOSIS — R899 Unspecified abnormal finding in specimens from other organs, systems and tissues: Secondary | ICD-10-CM

## 2019-05-21 ENCOUNTER — Encounter: Payer: Self-pay | Admitting: Oncology

## 2019-05-21 ENCOUNTER — Other Ambulatory Visit: Payer: Self-pay

## 2019-05-21 ENCOUNTER — Inpatient Hospital Stay: Payer: Managed Care, Other (non HMO) | Attending: Oncology | Admitting: Oncology

## 2019-05-21 ENCOUNTER — Inpatient Hospital Stay: Payer: Managed Care, Other (non HMO)

## 2019-05-21 VITALS — BP 118/82 | HR 62 | Temp 97.6°F

## 2019-05-21 DIAGNOSIS — D721 Eosinophilia, unspecified: Secondary | ICD-10-CM

## 2019-05-21 DIAGNOSIS — E538 Deficiency of other specified B group vitamins: Secondary | ICD-10-CM

## 2019-05-21 LAB — CBC WITH DIFFERENTIAL/PLATELET
Abs Immature Granulocytes: 0.02 10*3/uL (ref 0.00–0.07)
Basophils Absolute: 0.1 10*3/uL (ref 0.0–0.1)
Basophils Relative: 1 %
Eosinophils Absolute: 2.3 10*3/uL — ABNORMAL HIGH (ref 0.0–0.5)
Eosinophils Relative: 25 %
HCT: 47 % (ref 39.0–52.0)
Hemoglobin: 15.2 g/dL (ref 13.0–17.0)
Immature Granulocytes: 0 %
Lymphocytes Relative: 35 %
Lymphs Abs: 3.1 10*3/uL (ref 0.7–4.0)
MCH: 27.3 pg (ref 26.0–34.0)
MCHC: 32.3 g/dL (ref 30.0–36.0)
MCV: 84.4 fL (ref 80.0–100.0)
Monocytes Absolute: 0.5 10*3/uL (ref 0.1–1.0)
Monocytes Relative: 5 %
Neutro Abs: 3.1 10*3/uL (ref 1.7–7.7)
Neutrophils Relative %: 34 %
Platelets: 251 10*3/uL (ref 150–400)
RBC: 5.57 MIL/uL (ref 4.22–5.81)
RDW: 12.8 % (ref 11.5–15.5)
WBC: 9 10*3/uL (ref 4.0–10.5)
nRBC: 0 % (ref 0.0–0.2)

## 2019-05-21 LAB — LACTATE DEHYDROGENASE: LDH: 133 U/L (ref 98–192)

## 2019-05-21 LAB — TECHNOLOGIST SMEAR REVIEW: Plt Morphology: ADEQUATE

## 2019-05-21 LAB — VITAMIN B12: Vitamin B-12: 155 pg/mL — ABNORMAL LOW (ref 180–914)

## 2019-05-22 DIAGNOSIS — D721 Eosinophilia, unspecified: Secondary | ICD-10-CM | POA: Insufficient documentation

## 2019-05-22 MED ORDER — VITAMIN B-12 1000 MCG PO TABS: 1000.0000 ug | ORAL_TABLET | Freq: Every day | ORAL | 0 refills | Status: AC

## 2019-05-22 NOTE — Progress Notes (Signed)
Hematology/Oncology Consult note [name] [phone] Fax:([phone]   Patient Care Team: Burnard Hawthorne, FNP as PCP - General (Family Medicine)  REFERRING PROVIDER: Burnard Hawthorne, FNP  CHIEF COMPLAINTS/REASON FOR VISIT:  Evaluation of abnormal CBC  HISTORY OF PRESENTING ILLNESS:   Geoffrey Brown is a  33 y.o.  male with PMH listed below was seen in consultation at the request of  Burnard Hawthorne, FNP  for evaluation of abnormal CBC.  Patient reports feeling well he has had annual checkup with primary care provider and was found to have abnormal CBC. 04/29/2019 CBC showed normal hemoglobin and platelet counts.  Total white blood cell count 6.8.  Eosinophil percentage elevated at 19.6 and absolute eosinophil elevated at 1.3.  Also decreased neutrophil percentage 35.5. Repeat CBC on 05/14/2019 showed eosinophil percentage 22.1 with increased eosinophil absolute number at 2.0. Decreased neutrophil percentage 30.9 Patient denies any fever, cough, wheezing, unintentional weight loss, skin rash, diarrhea.  He traveled back to [place] in January 2020 and he remained healthy throughout the entire trip.   Denies any allergic symptoms, recent infection.  Review of Systems  Constitutional: Negative for appetite change, chills, fatigue, fever and unexpected weight change.  HENT:   Negative for hearing loss and voice change.   Eyes: Negative for eye problems and icterus.  Respiratory: Negative for chest tightness, cough and shortness of breath.   Cardiovascular: Negative for chest pain and leg swelling.  Gastrointestinal: Negative for abdominal distention and abdominal pain.  Endocrine: Negative for hot flashes.  Genitourinary: Negative for difficulty urinating, dysuria and frequency.   Musculoskeletal: Negative for arthralgias.  Skin: Negative for itching and rash.  Neurological: Negative for light-headedness and numbness.  Hematological: Negative  for adenopathy. Does not bruise/bleed easily.  Psychiatric/Behavioral: Negative for confusion.    MEDICAL HISTORY:  No past medical history on file.  He does not have any past medical history.  SURGICAL HISTORY: No past surgical history on file.  No past surgery  SOCIAL HISTORY: Social History   Socioeconomic History  . Marital status: Married    Spouse name: Not on file  . Number of children: Not on file  . Years of education: Not on file  . Highest education level: Not on file  Occupational History  . Not on file  Social Needs  . Financial resource strain: Not on file  . Food insecurity    Worry: Not on file    Inability: Not on file  . Transportation needs    Medical: Not on file    Non-medical: Not on file  Tobacco Use  . Smoking status: Never Smoker  . Smokeless tobacco: Never Used  Substance and Sexual Activity  . Alcohol use: Yes    Comment: occasional  . Drug use: No  . Sexual activity: Not on file  Lifestyle  . Physical activity    Days per week: Not on file    Minutes per session: Not on file  . Stress: Not on file  Relationships  . Social Herbalist on phone: Not on file    Gets together: Not on file    Attends religious service: Not on file    Active member of club or organization: Not on file    Attends meetings of clubs or organizations: Not on file    Relationship status: Not on file  . Intimate partner violence    Fear of current or ex partner: Not on file  Emotionally abused: Not on file    Physically abused: Not on file    Forced sexual activity: Not on file  Other Topics Concern  . 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 Diabetes Father   . Colon cancer Neg Hx   . Prostate cancer Neg Hx     ALLERGIES:  is allergic to lobster [shellfish allergy].  MEDICATIONS:  No current outpatient medications on file.   No  current facility-administered medications for this visit.   Patient is not taking any prescribed medication.  Takes over-the-counter vitamin D supplementation.   PHYSICAL EXAMINATION: ECOG PERFORMANCE STATUS: 0 - Asymptomatic Vitals:   05/21/19 1118  BP: 118/82  Pulse: 62  Temp: 97.6 F (36.4 C)   There were no vitals filed for this visit.  Physical Exam Constitutional:      General: He is not in acute distress. HENT:     Head: Normocephalic and atraumatic.  Eyes:     General: No scleral icterus.    Pupils: Pupils are equal, round, and reactive to light.  Neck:     Musculoskeletal: Normal range of motion and neck supple.  Cardiovascular:     Rate and Rhythm: Normal rate and regular rhythm.     Heart sounds: Normal heart sounds.  Pulmonary:     Effort: Pulmonary effort is normal. No respiratory distress.     Breath sounds: No wheezing.     Comments: Left anterior lower chest wall tenderness from recent injury from playing cricket Abdominal:     General: Bowel sounds are normal. There is no distension.     Palpations: Abdomen is soft. There is no mass.     Tenderness: There is no abdominal tenderness.  Musculoskeletal: Normal range of motion.        General: No deformity.  Skin:    General: Skin is warm and dry.     Findings: No erythema or rash.  Neurological:     Mental Status: He is alert and oriented to person, place, and time.     Cranial Nerves: No cranial nerve deficit.     Coordination: Coordination normal.  Psychiatric:        Behavior: Behavior normal.        Thought Content: Thought content normal.     LABORATORY DATA:  I have reviewed the data as listed Lab Results  Component Value Date   WBC 9.0 05/21/2019   HGB 15.2 05/21/2019   HCT 47.0 05/21/2019   MCV 84.4 05/21/2019   PLT 251 05/21/2019   Recent Labs    04/29/19 0926  NA 138  K 4.1  CL 102  CO2 28  GLUCOSE 100*  BUN 16  CREATININE 1.15  CALCIUM 9.8  PROT 7.7  ALBUMIN 4.9  AST  29  ALT 42  ALKPHOS 59  BILITOT 0.5   Iron/TIBC/Ferritin/ %Sat No results found for: IRON, TIBC, FERRITIN, IRONPCTSAT    RADIOGRAPHIC STUDIES: I have personally reviewed the radiological images as listed and agreed with the findings in the report.  No results found.    ASSESSMENT & PLAN:  1. Eosinophilia   2. Vitamin B12 deficiency    Previously labs were reviewed and discussed with patient.  He has a chronic increase of relative eosinophils percentage with increase of absolute eosinophils as well.  Patient does have hypereosinophilia with more  than 1500 eosinophils/ul.  He appears to be asymptomatic.  No constitutional symptoms, no cutaneous symptoms, no cardiac or respiratory symptoms as well. He is not taking any prescribed medication.  Denies taking any herbal supplements.  Physical examination unremarkable.  Discussed with patient that high eosinophils can be caused by numerous conditions, including allergic, infectious, inflammatory, or neoplastic disorders. Repeat CBC, smear, LDH, vitamin B12 level.   Labs showed vitamin B12 deficiency.  Will start patient on parenteral vitamin B12 supplementation. I called patient and discussed about plan.  He has persistent eosinophilia >1500, will repeat cbc in 2 weeks to confirm two elevated numbers separated by 1 month. If persistently elevated, check Tryptase, IgE, EKG, Echo, PFT, CXR/CT abdomen CT and maybe also tissue biopsy.  Molecular studies FIP1L1-PDGFRA mutation, flowcytometry,   Orders Placed This Encounter  Procedures  . CBC with Differential/Platelet    Standing Status:   Future    Number of Occurrences:   1    Standing Expiration Date:   05/20/2020  . Technologist smear review    Standing Status:   Future    Number of Occurrences:   1    Standing Expiration Date:   05/20/2020  . Lactate dehydrogenase    Standing Status:   Future    Number of Occurrences:   1    Standing Expiration Date:   05/20/2020  . Vitamin B12     Standing Status:   Future    Number of Occurrences:   1    Standing Expiration Date:   05/20/2020    All questions were answered. The patient knows to call the clinic with any problems questions or concerns.  cc Burnard Hawthorne, FNP    Return of visit: to be determined.  Thank you for this kind referral and the opportunity to participate in the care of this patient. A copy of today's note is routed to referring provider  Total face to face encounter time for this patient visit was 60 min. >50% of the time was  spent in counseling and coordination of care.    Earlie Server, MD, PhD Hematology Oncology [facility] Pager- [phone] 05/22/2019

## 2019-05-23 ENCOUNTER — Encounter: Payer: Self-pay | Admitting: Oncology

## 2019-06-03 ENCOUNTER — Inpatient Hospital Stay: Payer: Managed Care, Other (non HMO)

## 2019-06-03 ENCOUNTER — Other Ambulatory Visit: Payer: Self-pay

## 2019-06-03 DIAGNOSIS — D721 Eosinophilia, unspecified: Secondary | ICD-10-CM

## 2019-06-03 LAB — CBC WITH DIFFERENTIAL/PLATELET
Abs Immature Granulocytes: 0.02 10*3/uL (ref 0.00–0.07)
Basophils Absolute: 0.1 10*3/uL (ref 0.0–0.1)
Basophils Relative: 1 %
Eosinophils Absolute: 2.1 10*3/uL — ABNORMAL HIGH (ref 0.0–0.5)
Eosinophils Relative: 24 %
HCT: 48.1 % (ref 39.0–52.0)
Hemoglobin: 15.8 g/dL (ref 13.0–17.0)
Immature Granulocytes: 0 %
Lymphocytes Relative: 42 %
Lymphs Abs: 3.8 10*3/uL (ref 0.7–4.0)
MCH: 27.5 pg (ref 26.0–34.0)
MCHC: 32.8 g/dL (ref 30.0–36.0)
MCV: 83.7 fL (ref 80.0–100.0)
Monocytes Absolute: 0.5 10*3/uL (ref 0.1–1.0)
Monocytes Relative: 6 %
Neutro Abs: 2.4 10*3/uL (ref 1.7–7.7)
Neutrophils Relative %: 27 %
Platelets: 245 10*3/uL (ref 150–400)
RBC: 5.75 MIL/uL (ref 4.22–5.81)
RDW: 12.8 % (ref 11.5–15.5)
WBC: 8.9 10*3/uL (ref 4.0–10.5)
nRBC: 0 % (ref 0.0–0.2)

## 2019-06-04 LAB — TROPONIN T: Troponin T TROPT: <0.01 ng/mL (ref <0.011)

## 2019-06-05 LAB — TRYPTASE: Tryptase: 7.2 ug/L (ref 2.2–13.2)

## 2019-06-11 ENCOUNTER — Other Ambulatory Visit: Payer: Self-pay

## 2019-06-11 ENCOUNTER — Telehealth: Payer: Self-pay

## 2019-06-11 ENCOUNTER — Other Ambulatory Visit: Payer: Self-pay | Admitting: Oncology

## 2019-06-11 DIAGNOSIS — D7219 Other eosinophilia: Secondary | ICD-10-CM

## 2019-06-11 NOTE — Telephone Encounter (Signed)
Dr. Tasia Catchings recommending additional bloodwork and chest xray. She wants to see him 2 weeks after bloodwork gets done. Patient called and notified. He states that he is currrently out of town but will come back in 2 weeks. Appointments have been scheduled per patient availbility. Lab on 10/19 and MD on 11/3. He also was instructed and where to go for Chest xray.

## 2019-06-24 ENCOUNTER — Other Ambulatory Visit: Payer: Self-pay

## 2019-06-24 ENCOUNTER — Inpatient Hospital Stay: Payer: Managed Care, Other (non HMO) | Attending: Oncology

## 2019-06-24 ENCOUNTER — Ambulatory Visit
Admission: RE | Admit: 2019-06-24 | Discharge: 2019-06-24 | Disposition: A | Discharge status: Home / Self Care | Payer: Managed Care, Other (non HMO) | Source: Ambulatory Visit | Attending: Oncology | Admitting: Oncology

## 2019-06-24 DIAGNOSIS — D7219 Other eosinophilia: Secondary | ICD-10-CM | POA: Diagnosis not present

## 2019-06-24 DIAGNOSIS — E538 Deficiency of other specified B group vitamins: Secondary | ICD-10-CM | POA: Diagnosis not present

## 2019-06-24 DIAGNOSIS — D721 Eosinophilia, unspecified: Secondary | ICD-10-CM | POA: Diagnosis present

## 2019-06-25 ENCOUNTER — Other Ambulatory Visit: Payer: Self-pay

## 2019-06-25 DIAGNOSIS — D721 Eosinophilia, unspecified: Secondary | ICD-10-CM | POA: Insufficient documentation

## 2019-06-25 DIAGNOSIS — E538 Deficiency of other specified B group vitamins: Secondary | ICD-10-CM | POA: Insufficient documentation

## 2019-06-25 DIAGNOSIS — D7219 Other eosinophilia: Secondary | ICD-10-CM

## 2019-06-26 LAB — MULTIPLE MYELOMA PANEL, SERUM
Albumin SerPl Elph-Mcnc: 3.9 g/dL (ref 2.9–4.4)
Albumin/Glob SerPl: 1.2 (ref 0.7–1.7)
Alpha 1: 0.2 g/dL (ref 0.0–0.4)
Alpha2 Glob SerPl Elph-Mcnc: 0.7 g/dL (ref 0.4–1.0)
B-Globulin SerPl Elph-Mcnc: 1.2 g/dL (ref 0.7–1.3)
Gamma Glob SerPl Elph-Mcnc: 1.3 g/dL (ref 0.4–1.8)
Globulin, Total: 3.3 g/dL (ref 2.2–3.9)
IgA: 369 mg/dL (ref 90–386)
IgG (Immunoglobin G), Serum: 1322 mg/dL (ref 603–1613)
IgM (Immunoglobulin M), Srm: 94 mg/dL (ref 20–172)
Total Protein ELP: 7.2 g/dL (ref 6.0–8.5)

## 2019-06-27 LAB — COMP PANEL: LEUKEMIA/LYMPHOMA

## 2019-06-27 LAB — MISC LABCORP TEST (SEND OUT)
LabCorp test name: 8623
Labcorp test code: 8623

## 2019-07-03 ENCOUNTER — Encounter: Payer: Self-pay | Admitting: Oncology

## 2019-07-04 LAB — MISC LABCORP TEST (SEND OUT): Labcorp test code: 480940

## 2019-07-06 LAB — BCR-ABL1 FISH
Cells Analyzed: 200
Cells Counted: 200

## 2019-07-08 ENCOUNTER — Other Ambulatory Visit: Payer: Self-pay

## 2019-07-08 ENCOUNTER — Encounter: Payer: Self-pay | Admitting: Oncology

## 2019-07-08 LAB — FISH HES LEUKEMIA, 4Q12 REA

## 2019-07-08 LAB — JAK2 V617F, W REFLEX TO CALR/E12/MPL

## 2019-07-08 LAB — CALR + JAK2 E12-15 + MPL (REFLEXED)

## 2019-07-08 NOTE — Progress Notes (Signed)
Patient prescreened for appointment. Patient has no concerns or questions.  

## 2019-07-09 ENCOUNTER — Inpatient Hospital Stay: Payer: Managed Care, Other (non HMO) | Attending: Oncology | Admitting: Oncology

## 2019-07-09 ENCOUNTER — Other Ambulatory Visit: Payer: Self-pay

## 2019-07-09 ENCOUNTER — Inpatient Hospital Stay: Payer: Managed Care, Other (non HMO)

## 2019-07-09 VITALS — BP 131/82 | HR 77 | Temp 98.5°F | Resp 16 | Wt 188.3 lb

## 2019-07-09 DIAGNOSIS — E538 Deficiency of other specified B group vitamins: Secondary | ICD-10-CM | POA: Diagnosis not present

## 2019-07-09 DIAGNOSIS — D7219 Other eosinophilia: Secondary | ICD-10-CM | POA: Diagnosis present

## 2019-07-09 LAB — HEPATITIS C ANTIBODY: HCV Ab: NONREACTIVE

## 2019-07-09 LAB — VITAMIN B12: Vitamin B-12: 475 pg/mL (ref 180–914)

## 2019-07-09 NOTE — Progress Notes (Signed)
Hematology/Oncology Consult note [facility] [phone] Fax:([phone]   Patient Care Team: Burnard Hawthorne, FNP as PCP - General (Family Medicine)  REFERRING PROVIDER: Burnard Hawthorne, FNP  CHIEF COMPLAINTS/REASON FOR VISIT:  Follow-up for eosinophilia.  HISTORY OF PRESENTING ILLNESS:   Geoffrey Brown is a  32 y.o.  male with PMH listed below was seen in consultation at the request of  Burnard Hawthorne, FNP  for evaluation of abnormal CBC.  Patient reports feeling well he has had annual checkup with primary care provider and was found to have abnormal CBC. 04/29/2019 CBC showed normal hemoglobin and platelet counts.  Total white blood cell count 6.8.  Eosinophil percentage elevated at 19.6 and absolute eosinophil elevated at 1.3.  Also decreased neutrophil percentage 35.5. Repeat CBC on 05/14/2019 showed eosinophil percentage 22.1 with increased eosinophil absolute number at 2.0. Decreased neutrophil percentage 30.9 Patient denies any fever, cough, wheezing, unintentional weight loss, skin rash, diarrhea.  He traveled back to [place] in January 2020 and he remained healthy throughout the entire trip.   Denies any allergic symptoms, recent infection.  INTERVAL HISTORY Geoffrey Brown is a 33 y.o. male who has above history reviewed by me today presents for follow up visit for management of eosinophilia Problems and complaints are listed below: During the interval patient has had work-up done present to discuss results. He continues to be asymptomatic completely. Denies any respiratory symptoms.  No wheezing no cough. Denies any chest pain, nausea, vomiting, diarrhea, abdominal pain.  Review of Systems  Constitutional: Negative for appetite change, chills, diaphoresis, fatigue, fever and unexpected weight change.  HENT:   Negative for hearing loss, lump/mass, nosebleeds, sore throat and voice change.   Eyes: Negative for eye problems and icterus.   Respiratory: Negative for chest tightness, cough, hemoptysis, shortness of breath and wheezing.   Cardiovascular: Negative for chest pain and leg swelling.  Gastrointestinal: Negative for abdominal distention, abdominal pain, blood in stool, diarrhea, nausea and rectal pain.  Endocrine: Negative for hot flashes.  Genitourinary: Negative for bladder incontinence, difficulty urinating, dysuria, frequency, hematuria and nocturia.   Musculoskeletal: Negative for arthralgias, back pain, flank pain, gait problem and myalgias.  Skin: Negative for itching and rash.  Neurological: Negative for dizziness, gait problem, headaches, light-headedness, numbness and seizures.  Hematological: Negative for adenopathy. Does not bruise/bleed easily.  Psychiatric/Behavioral: Negative for confusion and decreased concentration. The patient is not nervous/anxious.     MEDICAL HISTORY:  History reviewed. No pertinent past medical history.  He does not have any past medical history.  SURGICAL HISTORY: History reviewed. No pertinent surgical history.  No past surgery  SOCIAL HISTORY: Social History   Socioeconomic History  . Marital status: Married    Spouse name: Not on file  . Number of children: Not on file  . Years of education: Not on file  . Highest education level: Not on file  Occupational History  . Not on file  Social Needs  . Financial resource strain: Not on file  . Food insecurity    Worry: Not on file    Inability: Not on file  . Transportation needs    Medical: Not on file    Non-medical: Not on file  Tobacco Use  . Smoking status: Never Smoker  . Smokeless tobacco: Never Used  Substance and Sexual Activity  . Alcohol use: Yes    Comment: occasional  . Drug use: No  . Sexual activity: Not on file  Lifestyle  . Physical  activity    Days per week: Not on file    Minutes per session: Not on file  . Stress: Not on file  Relationships  . Social Herbalist on phone: Not  on file    Gets together: Not on file    Attends religious service: Not on file    Active member of club or organization: Not on file    Attends meetings of clubs or organizations: Not on file    Relationship status: Not on file  . Intimate partner violence    Fear of current or ex partner: Not on file    Emotionally abused: Not on file    Physically abused: Not on file    Forced sexual activity: Not on file  Other Topics Concern  . 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 Diabetes Father   . Colon cancer Neg Hx   . Prostate cancer Neg Hx     ALLERGIES:  is allergic to lobster [shellfish allergy].  MEDICATIONS:  Current Outpatient Medications  Medication Sig Dispense Refill  . vitamin B-12 (CYANOCOBALAMIN) 1000 MCG tablet Take 1 tablet (1,000 mcg total) by mouth daily. 90 tablet 0   No current facility-administered medications for this visit.   Patient is not taking any prescribed medication.  Takes over-the-counter vitamin D supplementation.   PHYSICAL EXAMINATION: ECOG PERFORMANCE STATUS: 0 - Asymptomatic Vitals:   07/09/19 1325  BP: 131/82  Pulse: 77  Resp: 16  Temp: 98.5 F (36.9 C)   Filed Weights   07/09/19 1325  Weight: 188 lb 4.8 oz (85.4 kg)    Physical Exam Constitutional:      General: He is not in acute distress. HENT:     Head: Normocephalic and atraumatic.  Eyes:     General: No scleral icterus.    Pupils: Pupils are equal, round, and reactive to light.  Neck:     Musculoskeletal: Normal range of motion and neck supple.  Cardiovascular:     Rate and Rhythm: Normal rate and regular rhythm.     Heart sounds: Normal heart sounds.  Pulmonary:     Effort: Pulmonary effort is normal. No respiratory distress.     Breath sounds: No wheezing.     Comments: Left anterior lower chest wall tenderness from recent injury from playing cricket  Abdominal:     General: Bowel sounds are normal. There is no distension.     Palpations: Abdomen is soft. There is no mass.     Tenderness: There is no abdominal tenderness.  Musculoskeletal: Normal range of motion.        General: No deformity.  Skin:    General: Skin is warm and dry.     Findings: No erythema or rash.  Neurological:     Mental Status: He is alert and oriented to person, place, and time.     Cranial Nerves: No cranial nerve deficit.     Coordination: Coordination normal.  Psychiatric:        Behavior: Behavior normal.        Thought Content: Thought content normal.     LABORATORY DATA:  I have reviewed the data as listed Lab Results  Component Value Date   WBC 8.9 06/03/2019   HGB 15.8 06/03/2019   HCT 48.1 06/03/2019   MCV 83.7 06/03/2019  PLT 245 06/03/2019   Recent Labs    04/29/19 0926  NA 138  K 4.1  CL 102  CO2 28  GLUCOSE 100*  BUN 16  CREATININE 1.15  CALCIUM 9.8  PROT 7.7  ALBUMIN 4.9  AST 29  ALT 42  ALKPHOS 59  BILITOT 0.5   Iron/TIBC/Ferritin/ %Sat No results found for: IRON, TIBC, FERRITIN, IRONPCTSAT    RADIOGRAPHIC STUDIES: I have personally reviewed the radiological images as listed and agreed with the findings in the report.  Dg Chest 2 View  Result Date: 06/24/2019 CLINICAL DATA:  Eosinophilia EXAM: CHEST - 2 VIEW COMPARISON:  None. FINDINGS: Lungs are clear. Heart size and pulmonary vascularity are normal. No adenopathy. No pneumothorax. No bone lesions. IMPRESSION: No edema or consolidation. Electronically Signed   By: Lowella Grip III M.D.   On: 06/24/2019 16:04      ASSESSMENT & PLAN:  1. Eosinophilic leukocytosis, unspecified type   2. Vitamin B12 deficiency    Persistent hyper eosinophilia, meeting criteria of hypereosinophilia syndrome. Patient is asymptomatic Labs are reviewed and discussed with patient. Negative Jak 2 V617F reflex to CALR/E12/MPL mutations. Negative BCR ABL FISH Normal multiple  myeloma panel. Normal tryptase Normal troponin T Normal smear morphology C-kit negative HES leukemia panel negative Chest x-ray negative and the patient does not have any respiratory symptoms. Stool ova and parasite negative. #We will check MPN/HES FISH panel-  FIP1L1-PDGFRA mutation- negative  I discussed with patient about a bone marrow biopsy if all these tests are negative.  Bone marrow biopsy can be considered.  Patient declined.  #Vitamin B12 deficiency, will check antiparietal and intrinsic factor antibody panel.- negative.  Patient has been on oral vitamin B12 supplements and will repeat B12 level.-improved to 475  # hepatitis C antibody -negative and hepatitis B surface antigen- negative.   Eosinophil is likely reactive. Continue observation.  Follow up in 6 months.   Orders Placed This Encounter  Procedures  . Anti-parietal antibody    Standing Status:   Future    Number of Occurrences:   1    Standing Expiration Date:   07/08/2020  . Intrinsic Factor Antibodies    Standing Status:   Future    Number of Occurrences:   1    Standing Expiration Date:   07/08/2020  . Vitamin B12    Standing Status:   Future    Number of Occurrences:   1    Standing Expiration Date:   07/08/2020  . Hepatitis B surface antibody    Standing Status:   Future    Number of Occurrences:   1    Standing Expiration Date:   07/08/2020  . Hepatitis C antibody    Standing Status:   Future    Number of Occurrences:   1    Standing Expiration Date:   07/08/2020  . MPN w/Hypereosinophilia FISH    Standing Status:   Future    Number of Occurrences:   1    Standing Expiration Date:   07/08/2020  . CBC with Differential/Platelet    Standing Status:   Future    Standing Expiration Date:   07/08/2020    All questions were answered. The patient knows to call the clinic with any problems questions or concerns.  cc Burnard Hawthorne, FNP    Return of visit: 6 months.  Thank you for this kind  referral and the opportunity to participate in the care of this patient. A copy of today's note  is routed to referring provider  Total face to face encounter time for this patient visit was 25 min. >50% of the time was  spent in counseling and coordination of care.    Earlie Server, MD, PhD Hematology Oncology [facility] Pager- [phone] 07/09/2019

## 2019-07-09 NOTE — Progress Notes (Signed)
Patient does not offer any problems today.  

## 2019-07-10 ENCOUNTER — Telehealth: Payer: Self-pay

## 2019-07-10 LAB — HEPATITIS B SURFACE ANTIBODY, QUANTITATIVE: Hep B S AB Quant (Post): 29.4 m[IU]/mL (ref >9.9)

## 2019-07-10 LAB — ANTI-PARIETAL ANTIBODY: Parietal Cell Antibody-IgG: 1.9 Units (ref 0.0–20.0)

## 2019-07-10 LAB — INTRINSIC FACTOR ANTIBODIES: Intrinsic Factor: 1.1 AU/mL (ref 0.0–1.1)

## 2019-07-10 NOTE — Telephone Encounter (Signed)
Called to add test Hepatitis B Surface Antigen ([facility] test code [id number]) to labs drawn yesterday.  Could not cancel the hepatitis B surface antibody due to already being processed.

## 2019-07-15 LAB — MISC LABCORP TEST (SEND OUT): Labcorp test code: 6510

## 2019-07-22 LAB — MPN W/HYPEREOSINOPHILIA FISH

## 2019-10-07 ENCOUNTER — Other Ambulatory Visit: Payer: Managed Care, Other (non HMO)

## 2019-10-07 ENCOUNTER — Ambulatory Visit: Payer: Managed Care, Other (non HMO) | Admitting: Oncology

## 2019-12-16 ENCOUNTER — Ambulatory Visit: Payer: Managed Care, Other (non HMO) | Attending: Internal Medicine

## 2019-12-16 DIAGNOSIS — Z20822 Contact with and (suspected) exposure to covid-19: Secondary | ICD-10-CM

## 2019-12-17 LAB — NOVEL CORONAVIRUS, NAA: SARS-CoV-2, NAA: NOT DETECTED

## 2019-12-17 LAB — SARS-COV-2, NAA 2 DAY TAT

## 2020-01-27 ENCOUNTER — Inpatient Hospital Stay: Payer: Self-pay

## 2020-01-27 ENCOUNTER — Inpatient Hospital Stay: Payer: Self-pay | Admitting: Oncology

## 2020-02-10 ENCOUNTER — Telehealth: Payer: Self-pay | Admitting: Oncology

## 2020-02-10 NOTE — Telephone Encounter (Signed)
Patient phoned on this date stating that he was out of the country and did not know when he would return. Patient wanted his appts on 02-13-20 cancelled and stated that he would reschedule at another time, if needed when he returns. 02-13-20 appts cancelled per patient's request.

## 2020-02-13 ENCOUNTER — Inpatient Hospital Stay: Payer: Self-pay | Admitting: Oncology

## 2020-02-13 ENCOUNTER — Inpatient Hospital Stay: Payer: Self-pay

## 2021-06-01 IMAGING — CR DG CHEST 2V
2 series · 2 of 2 positions shown · non-contrast
Comparison: None.

CLINICAL DATA: Eosinophilia

EXAM:
CHEST - 2 VIEW

[chest pa]
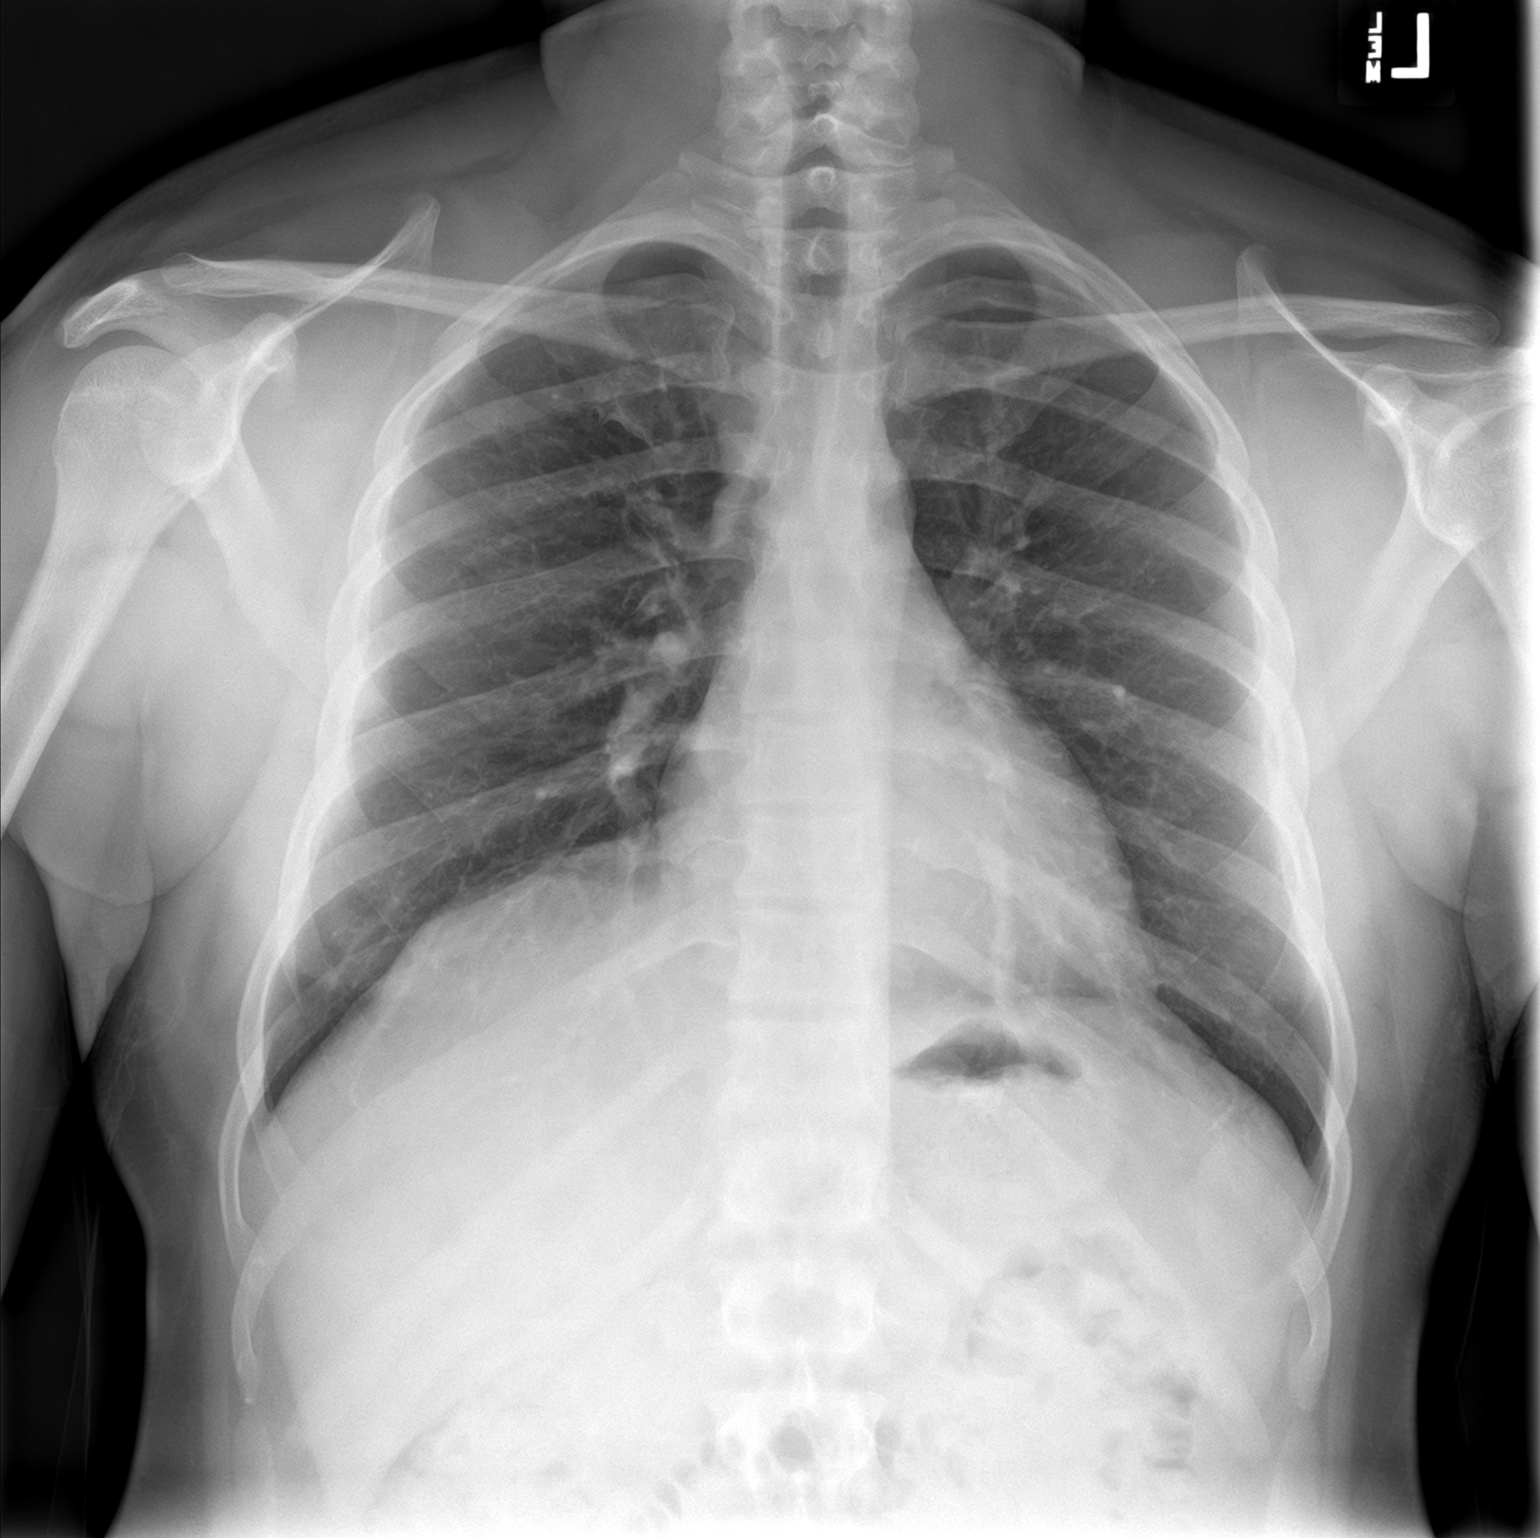

[chest lat]
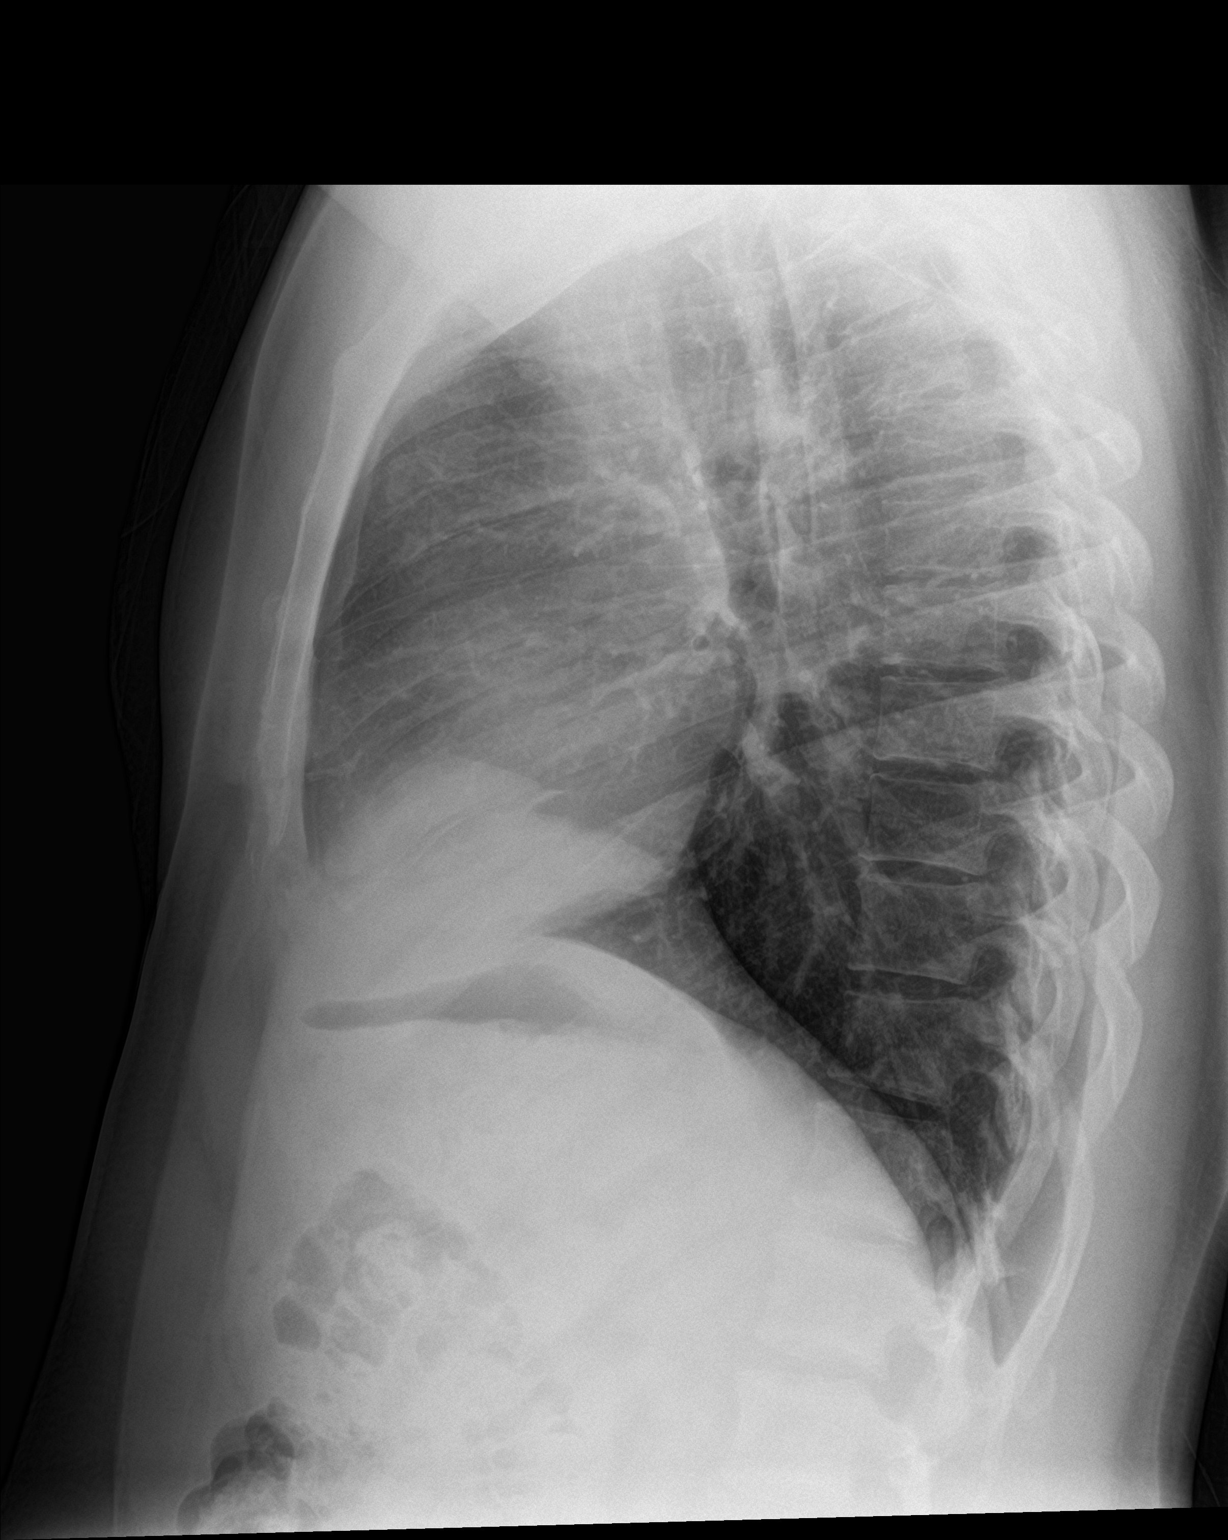

[2 of 2 positions shown; findings below may reference images not displayed]

FINDINGS: Lungs are clear. Heart size and pulmonary vascularity are normal. No
adenopathy. No pneumothorax. No bone lesions.
IMPRESSION: No edema or consolidation.
